# Patient Record
Sex: Female | Born: 1945 | Race: White | Hispanic: No | Marital: Married | Smoking: Former smoker
Health system: Southern US, Community
[De-identification: ages and names within clinical notes are randomized; demographics above are authoritative.]

## PROBLEM LIST (undated history)

## (undated) DIAGNOSIS — M1990 Unspecified osteoarthritis, unspecified site: Secondary | ICD-10-CM

## (undated) DIAGNOSIS — M810 Age-related osteoporosis without current pathological fracture: Principal | ICD-10-CM

## (undated) DIAGNOSIS — H9319 Tinnitus, unspecified ear: Secondary | ICD-10-CM

## (undated) DIAGNOSIS — R5383 Other fatigue: Secondary | ICD-10-CM

## (undated) DIAGNOSIS — N3943 Post-void dribbling: Secondary | ICD-10-CM

## (undated) DIAGNOSIS — M549 Dorsalgia, unspecified: Secondary | ICD-10-CM

## (undated) DIAGNOSIS — R51 Headache: Secondary | ICD-10-CM

## (undated) DIAGNOSIS — R0989 Other specified symptoms and signs involving the circulatory and respiratory systems: Secondary | ICD-10-CM

## (undated) DIAGNOSIS — C4490 Unspecified malignant neoplasm of skin, unspecified: Secondary | ICD-10-CM

## (undated) DIAGNOSIS — C50919 Malignant neoplasm of unspecified site of unspecified female breast: Secondary | ICD-10-CM

## (undated) DIAGNOSIS — K219 Gastro-esophageal reflux disease without esophagitis: Secondary | ICD-10-CM

## (undated) HISTORY — DX: Other fatigue: R53.83

## (undated) HISTORY — PX: COLONOSCOPY: SHX174

## (undated) HISTORY — DX: Headache: R51

## (undated) HISTORY — DX: Dorsalgia, unspecified: M54.9

## (undated) HISTORY — DX: Post-void dribbling: N39.43

## (undated) HISTORY — DX: Age-related osteoporosis without current pathological fracture: M81.0

## (undated) HISTORY — DX: Other specified symptoms and signs involving the circulatory and respiratory systems: R09.89

## (undated) HISTORY — DX: Tinnitus, unspecified ear: H93.19

## (undated) HISTORY — DX: Unspecified malignant neoplasm of skin, unspecified: C44.90

## (undated) HISTORY — DX: Malignant neoplasm of unspecified site of unspecified female breast: C50.919

---

## 1980-12-06 HISTORY — PX: TUBAL LIGATION: SHX77

## 1984-12-06 HISTORY — PX: EYE SURGERY: SHX253

## 2002-10-22 ENCOUNTER — Other Ambulatory Visit: Admission: RE | Admit: 2002-10-22 | Discharge: 2002-10-22 | Payer: Self-pay | Admitting: *Deleted

## 2003-01-07 ENCOUNTER — Encounter: Admission: RE | Admit: 2003-01-07 | Discharge: 2003-01-07 | Payer: Self-pay | Admitting: *Deleted

## 2003-02-25 ENCOUNTER — Ambulatory Visit (HOSPITAL_COMMUNITY): Admission: RE | Admit: 2003-02-25 | Discharge: 2003-02-25 | Payer: Self-pay | Admitting: Gastroenterology

## 2003-12-23 ENCOUNTER — Other Ambulatory Visit: Admission: RE | Admit: 2003-12-23 | Discharge: 2003-12-23 | Payer: Self-pay | Admitting: *Deleted

## 2007-07-11 ENCOUNTER — Encounter: Admission: RE | Admit: 2007-07-11 | Discharge: 2007-07-11 | Payer: Self-pay | Admitting: Obstetrics and Gynecology

## 2008-01-31 ENCOUNTER — Encounter: Admission: RE | Admit: 2008-01-31 | Discharge: 2008-01-31 | Payer: Self-pay | Admitting: Surgery

## 2009-02-04 ENCOUNTER — Encounter: Admission: RE | Admit: 2009-02-04 | Discharge: 2009-02-04 | Payer: Self-pay | Admitting: Orthopedic Surgery

## 2011-04-23 NOTE — Op Note (Signed)
   NAMEWENDA, Kristina Curry                             ACCOUNT NO.:  [id number]   MEDICAL RECORD NO.:  [id number]                   PATIENT TYPE:  AMB   LOCATION:  ENDO                                 FACILITY:  [facility]   PHYSICIAN:  Anselmo Rod, M.D.               DATE OF BIRTH:  02-15-1946   DATE OF PROCEDURE:  02/25/2003  DATE OF DISCHARGE:                                 OPERATIVE REPORT   PROCEDURE:  Screening colonoscopy.   ENDOSCOPIST:  Anselmo Rod, M.D.   INSTRUMENT USED:  Olympus video colonoscope.   INDICATIONS FOR PROCEDURE:  A 65 year old white female underwent a screening  colonoscopy to rule out colonic polyps, masses, etc.   PRE-PROCEDURE PREPARATION:  An informed consent was procured from the  patient and the patient was fasted for eight hours prior to the procedure  and prepped with a bottle of __________ and MiraLax on the night prior to  the procedure.   PRE-PROCEDURE PHYSICAL EXAM:  VITAL SIGNS:  Stable.  NECK:  Supple.  CHEST:  Clear to auscultation.  HEART:  S1, S2 regular.  ABDOMEN:  Soft with normal bowel sounds.   DESCRIPTION OF PROCEDURE:  The patient was placed in the left lateral  decubitus position and sedated with 50 mg of Demerol and 5 mg of Versed  intravenously.  Once the patient was adequately sedated and maintained on  low-flow oxygen and continuous cardiac monitoring, the Olympus video  colonoscope was advanced into the rectum to the cecum without difficulty.  There were a few scattered diverticula seen throughout the colon.  The  appendicular orifice and the ileocecal valve were clearly visualized and  photographed.  No masses, polyps, erosions, or ulcerations were present.  On  retroflexion in the rectum, revealed no acute abnormalities.  The patient tolerated the procedure well without complications.   IMPRESSION:  1. A few scattered diverticula.  2. No masses or polyps seen.   RECOMMENDATIONS:  1. Repeat colorectal cancer  screening is recommended in the next five years,     unless the patient develops any abnormal symptoms.  2.     Outpatient followup on a p.r.n. basis.  3. A high fiber diet with liberal fluid intake, 20-25 g of fiber recommended     in the diet, along with liberal fluid intake.                                               Anselmo Rod, M.D.    JNM/MEDQ  D:  02/25/2003  T:  02/25/2003  Job:  [id number]   cc:   Pershing Cox, M.D.  [address]  [address]  [place]  [place] [address]  Fax: [phone]

## 2012-07-20 ENCOUNTER — Other Ambulatory Visit: Payer: Self-pay

## 2012-07-21 ENCOUNTER — Other Ambulatory Visit: Payer: Self-pay | Admitting: *Deleted

## 2012-07-21 ENCOUNTER — Other Ambulatory Visit: Payer: Self-pay | Admitting: Radiology

## 2012-07-21 DIAGNOSIS — C50412 Malignant neoplasm of upper-outer quadrant of left female breast: Secondary | ICD-10-CM | POA: Insufficient documentation

## 2012-07-21 DIAGNOSIS — Z170 Estrogen receptor positive status [ER+]: Secondary | ICD-10-CM | POA: Insufficient documentation

## 2012-07-21 DIAGNOSIS — C50912 Malignant neoplasm of unspecified site of left female breast: Secondary | ICD-10-CM

## 2012-07-21 DIAGNOSIS — C50419 Malignant neoplasm of upper-outer quadrant of unspecified female breast: Secondary | ICD-10-CM

## 2012-07-24 ENCOUNTER — Telehealth: Payer: Self-pay | Admitting: *Deleted

## 2012-07-24 ENCOUNTER — Ambulatory Visit
Admission: RE | Admit: 2012-07-24 | Discharge: 2012-07-24 | Disposition: A | Discharge status: Home / Self Care | Payer: Medicare Other | Source: Ambulatory Visit | Attending: Radiology | Admitting: Radiology

## 2012-07-24 DIAGNOSIS — C50912 Malignant neoplasm of unspecified site of left female breast: Secondary | ICD-10-CM

## 2012-07-24 MED ORDER — GADOBENATE DIMEGLUMINE 529 MG/ML IV SOLN
15.0000 mL | Freq: Once | INTRAVENOUS | Status: AC | PRN
  Administered 2012-07-24: 15 mL via INTRAVENOUS

## 2012-07-24 NOTE — Telephone Encounter (Signed)
Confirmed [facility] for 07/26/12 at 0800 .  Instructions and contact information given.  

## 2012-07-26 ENCOUNTER — Encounter: Payer: Self-pay | Admitting: *Deleted

## 2012-07-26 ENCOUNTER — Ambulatory Visit: Payer: Medicare Other | Admitting: Genetic Counselor

## 2012-07-26 ENCOUNTER — Ambulatory Visit
Admission: RE | Admit: 2012-07-26 | Discharge: 2012-07-26 | Disposition: A | Discharge status: Home / Self Care | Payer: Medicare Other | Source: Ambulatory Visit | Attending: Radiation Oncology | Admitting: Radiation Oncology

## 2012-07-26 ENCOUNTER — Ambulatory Visit: Payer: Medicare Other | Attending: Surgery | Admitting: Physical Therapy

## 2012-07-26 ENCOUNTER — Ambulatory Visit: Payer: Medicare Other

## 2012-07-26 ENCOUNTER — Ambulatory Visit (HOSPITAL_BASED_OUTPATIENT_CLINIC_OR_DEPARTMENT_OTHER): Payer: Medicare Other | Admitting: Surgery

## 2012-07-26 ENCOUNTER — Other Ambulatory Visit (HOSPITAL_BASED_OUTPATIENT_CLINIC_OR_DEPARTMENT_OTHER): Payer: Medicare Other | Admitting: Lab

## 2012-07-26 ENCOUNTER — Other Ambulatory Visit (INDEPENDENT_AMBULATORY_CARE_PROVIDER_SITE_OTHER): Payer: Self-pay | Admitting: Surgery

## 2012-07-26 ENCOUNTER — Encounter: Payer: Self-pay | Admitting: Genetic Counselor

## 2012-07-26 ENCOUNTER — Ambulatory Visit (HOSPITAL_BASED_OUTPATIENT_CLINIC_OR_DEPARTMENT_OTHER): Payer: Medicare Other | Admitting: Oncology

## 2012-07-26 VITALS — BP 130/90 | HR 59 | Temp 98.0°F | Resp 20 | Ht 62.0 in | Wt 160.6 lb

## 2012-07-26 DIAGNOSIS — C50919 Malignant neoplasm of unspecified site of unspecified female breast: Secondary | ICD-10-CM | POA: Insufficient documentation

## 2012-07-26 DIAGNOSIS — M25619 Stiffness of unspecified shoulder, not elsewhere classified: Secondary | ICD-10-CM | POA: Insufficient documentation

## 2012-07-26 DIAGNOSIS — C50419 Malignant neoplasm of upper-outer quadrant of unspecified female breast: Secondary | ICD-10-CM

## 2012-07-26 DIAGNOSIS — IMO0001 Reserved for inherently not codable concepts without codable children: Secondary | ICD-10-CM | POA: Insufficient documentation

## 2012-07-26 DIAGNOSIS — M40299 Other kyphosis, site unspecified: Secondary | ICD-10-CM | POA: Insufficient documentation

## 2012-07-26 DIAGNOSIS — Z170 Estrogen receptor positive status [ER+]: Secondary | ICD-10-CM

## 2012-07-26 LAB — COMPREHENSIVE METABOLIC PANEL
ALT: 11 U/L (ref 0–35)
AST: 13 U/L (ref 0–37)
Alkaline Phosphatase: 55 U/L (ref 39–117)
BUN: 23 mg/dL (ref 6–23)
Calcium: 9.6 mg/dL (ref 8.4–10.5)
Chloride: 104 mEq/L (ref 96–112)
Creatinine, Ser: 0.74 mg/dL (ref 0.50–1.10)

## 2012-07-26 LAB — CBC WITH DIFFERENTIAL/PLATELET
BASO%: 1 % (ref 0.0–2.0)
Basophils Absolute: 0.1 10*3/uL (ref 0.0–0.1)
EOS%: 3.1 % (ref 0.0–7.0)
HCT: 38.8 % (ref 34.8–46.6)
MCH: 28.6 pg (ref 25.1–34.0)
MCHC: 32.8 g/dL (ref 31.5–36.0)
MCV: 87.4 fL (ref 79.5–101.0)
MONO%: 12.3 % (ref 0.0–14.0)
NEUT%: 56.7 % (ref 38.4–76.8)
RDW: 14.4 % (ref 11.2–14.5)
lymph#: 1.5 10*3/uL (ref 0.9–3.3)

## 2012-07-26 LAB — CANCER ANTIGEN 27.29: CA 27.29: 19 U/mL (ref 0–39)

## 2012-07-26 NOTE — Progress Notes (Addendum)
 Re:   Kristina Curry DOB:   03/21/1946 MRN:   [id number]  [facility]  ASSESSMENT AND PLAN: 1.  Left breast cancer  1.6 cm on MR, 3 o'clock  IDC, PR - 90%, PR - 90%, Her2Neu - neg, Ki67 - 20%  I discussed the options for breast cancer treatment with the patient.  I discussed the idea of a multidisciplinary approach to the treatment of breast cancer, which often includes medical oncology and radiation oncology.  I discussed the surgical options of lumpectomy vs. mastectomy.  If mastectomy, there is the possibility of reconstruction.   I discussed the options of lymph node biopsy.  The treatment plan depends on the pathologic staging of the tumor and the patient's personal wishes.  The risks of surgery include, but are not limited to, bleeding, infection, the need for further surgery, and nerve injury.  The patient has been given literature on the treatment of breast cancer.  Dr. Rubin and Wentworth are treating oncologist.  Plan:  Left breast lumpectomy (needle loc) and left axillary SLNBx.  2.  Strong family history of breast cancer - to see genetic  [BRCA 1/2 negative.  DN 08/10/2012] 3.  Umbilical hernia. 4.  Takes nightly sleeping pill. 5.  Uses vaginal estrogen cream she is going to stop. 6.  I gave her a note for jury duty.  REFERRING PHYSICIAN:  Dr. M. Bertrand  HISTORY OF PRESENT ILLNESS: Kristina Curry is a 66 y.o. (DOB: 01/08/1946)  white female whose primary care physician is Mark Mahoney, MD ([place], [place]) and comes to multi-disciplinary breast cancer clinic.  Her last mammogram was over 16 -17 months earlier.  She went for her normal mammogram and an abnormality was found in the left breast at 3 o'clock.  She had a biopsy on 07/18/2012 that showed IDC at [facility].  She has no prior history of breast biopsy or problems.  She comes from [place].  She sees Beth Lane at [facility] in [place].  She has a strong fam hx of breast cancer.  Her mother got breast cancer at  58 yo (she died at 82 of metastatic disease).  Her father's sister had breast cancer (when she was older).  Her mother's sister had breast cancer (when she was older).  She has one sister who has no breast problems.   Past Medical History  Diagnosis Date  . Breast cancer   . Fatigue   . Ringing in ears   . Sinus complaint   . Dribbling of urine   . Skin cancer   . Back pain   . Headache      No past surgical history on file.    Current Outpatient Prescriptions  Medication Sig Dispense Refill  . acetaminophen (TYLENOL) 325 MG tablet Take 650 mg by mouth as needed.      . ALPRAZolam (XANAX) 0.5 MG tablet Take 0.5 mg by mouth at bedtime as needed.      . estradiol (VAGIFEM) 25 MCG vaginal tablet Place 10 mcg vaginally 2 (two) times a week.      . Famotidine (PEPCID PO) Take 20 mg by mouth as needed.      . zolpidem (AMBIEN) 10 MG tablet Take 10 mg by mouth at bedtime as needed.         Not on File  REVIEW OF SYSTEMS: Skin:  No history of rash.  No history of abnormal moles. Infection:  No history of hepatitis or HIV.  No history of MRSA. Neurologic:    No history of stroke.  No history of seizure.  No history of headaches. Cardiac:  No history of hypertension. No history of heart disease.  No history of prior cardiac catheterization. Pulmonary:  Smokes 1 to 2 cigarettes per day.  Endocrine:  No diabetes. No thyroid disease. Gastrointestinal:  GERD  No history of liver disease.  No history of gall bladder disease.  No history of pancreas disease.  No history of colon disease. Urologic:  No history of kidney stones.  No history of bladder infections. Musculoskeletal:  No history of joint or back disease. Hematologic:  No bleeding disorder.  No history of anemia.  Not anticoagulated. Psycho-social:  The patient is oriented.   The patient has no obvious psychologic or social impairment to understanding our conversation and plan.  SOCIAL and FAMILY HISTORY: Married.  Husband with  her. 2 sons - 31 and 34. 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  PHYSICAL EXAM: There were no vitals taken for this visit.  General: WN older WF who is alert and generally healthy appearing.  HEENT: Normal. Pupils equal. Neck: Supple. No mass.  No thyroid mass. Lymph Nodes:  No supraclavicular, cervical nodes, axillary nodes. Breast:  Left - bruise and 1 cm palpable nodule at 3 o'clock  Right - negative  Lungs: Clear to auscultation and symmetric breath sounds. Heart:  RRR. No murmur or rub. Abdomen: Soft. No mass. No tenderness.  Normal bowel sounds.  TL scar.  Umbilical hernia. Extremities:  Good strength and ROM  in upper and lower extremities. Neurologic:  Grossly intact to motor and sensory function. Psychiatric: Has normal mood and affect. Behavior is normal.   DATA REVIEWED: Radiology and path reports. I spent at least 45 minutes with the patient. At least 50% of that was face to face.  Lelon Ikard, MD,  FACS [facility] [address]  [address]   [place], [place]    [address] Phone:  [phone] FAX:  [phone]  

## 2012-07-26 NOTE — Progress Notes (Signed)
Kristina Curry [id number] Feb 28, 1946 66 y.o. 07/26/2012 10:45 PM  CC Dr. Lorelei Pont  No primary provider on file. No primary provider on file.  REASON FOR CONSULTATION:  Breast cancer Patient was seen in the Multidisciplinary Breast Clinic for discussion of her treatment options. She was seen by Dr. Drue Second, Radiation Onc[facility]  STAGE:   Cancer of upper-outer quadrant of female breast   Primary site: Breast (Left)   Staging method: AJCC 7th Edition   Clinical: Stage IA (T1c, N0, cM0)   Summary: Stage IA (T1c, N0, cM0)  REFERRING PHYSICIAN: Dr. Lorelei Pont  HISTORY OF PRESENT ILLNESS:  Kristina Curry is a 66 y.o. female.  From [facility] who presents with abnormal mammogram. She did not detect any abnormalities in her breasts. are soft. She had a screening mammogram on 07/10/2012. Area abnormalities detected in the upper outer left breast. Additional views were recommended. A 1.7 cm area of focal asymmetry with poorly defined borders was seen. A biopsy is recommended and took place on 07/20/2012 this showed invasive ductal cancer grade 2, ER positive percent PR positive under present, HER-2 was negative and Ki-67 16%.  Past Medical History: Past Medical History  Diagnosis Date  . Breast cancer   . Fatigue   . Ringing in ears   . Sinus complaint   . Dribbling of urine   . Skin cancer   . Back pain   . Headache     Past Surgical History: No past surgical history on file.  Family History: Family History  Problem Relation Age of Onset  . Breast cancer Mother 44  . Bone cancer Father 60  . Breast cancer Maternal Aunt 80  . Breast cancer Paternal Aunt 52    double mastectomy  . Breast cancer Paternal Aunt     diagnosed in her 54s  . Breast cancer Paternal Aunt     diagnosed in her 82s    Social History History  Substance Use Topics  . Smoking status: Current Everyday Smoker  . Smokeless tobacco: Not on  file  . Alcohol Use: 4.2 oz/week    7 Glasses of wine per week   she is married and 4 I am husband Nadine Counts and they have 2 children ages 42, 87 for both single. 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 Her husband is retired.  Allergies: Not on File  Current Medications: Current Outpatient Prescriptions  Medication Sig Dispense Refill  . acetaminophen (TYLENOL) 325 MG tablet Take 650 mg by mouth as needed.      . ALPRAZolam (XANAX) 0.5 MG tablet Take 0.5 mg by mouth at bedtime as needed.      Marland Kitchen estradiol (VAGIFEM) 25 MCG vaginal tablet Place 10 mcg vaginally 2 (two) times a week.      . Famotidine (PEPCID PO) Take 20 mg by mouth as needed.      . zolpidem (AMBIEN) 10 MG tablet Take 10 mg by mouth at bedtime as needed.        OB/GYN History: G2 P2, menarche 48. She took hormone replacement therapy after menopause starting at age 59 and start stop this and 2005.  Fertility Discussion: nmo Prior History of Cancer: no  Health Maintenance:  Colonoscopy yes Bone Density yes Last PAP smear yes 2012  ECOG PERFORMANCE STATUS: 0 - Asymptomatic  Genetic Counseling/testing: tba  REVIEW OF SYSTEMS:  A comprehensive review of systems was negative.  PHYSICAL EXAMINATION: Blood pressure 130/90, pulse 59,  temperature 98 F (36.7 C), resp. rate 20, height 5\' 2"  (1.575 m), weight 160 lb 9.6 oz (72.848 kg).  HEENT:  Sclerae anicteric, conjunctivae pink.  Oropharynx clear.  No mucositis or candidiasis.  Nodes:  No cervical, supraclavicular, or axillary lymphadenopathy palpated.  Breast Exam:  Right breast is benign.  No masses, discharge, skin change, or nipple inversion.  Left breast is benign, there is a biopsy site present. No masses, discharge, skin change, or nipple inversion..  Lungs:  Clear to auscultation bilaterally.  No crackles, rhonchi, or wheezes.  Heart:  Regular rate and rhythm.  Abdomen:  Soft, nontender.  Positive bowel sounds.  No organomegaly or masses  palpated.  Musculoskeletal:  No focal spinal tenderness to palpation.  Extremities:  Benign.  No peripheral edema or cyanosis.  Skin:  Benign.  Neuro:  Nonfocal.      STUDIES/RESULTS: Mr Breast Bilateral W Wo Contrast  07/25/2012  *RADIOLOGY REPORT*  Clinical Data: Recent diagnosis of grade II breast carcinoma following ultrasound-guided core biopsy of mass in the 2 o'clock location of the left breast. The patient's mother, maternal aunt, and three paternal aunts were diagnosed with breast cancer.  BILATERAL BREAST MRI WITH AND WITHOUT CONTRAST  Technique: Multiplanar, multisequence MR images of both breasts were obtained prior to and following the intravenous administration of 15ml of Multihance.  Three dimensional images were evaluated at the independent DynaCad workstation.  Comparison:  Mammogram and ultrasound from [facility] 07/20/2012 and earlier  Findings: Background parenchymal enhancement is moderate.  Within the lateral midportion of the left breast, there is an enhancing mass which shows persistent type kinetics.  This is associated with clip artifact following recent ultrasound guided core biopsy showing malignancy.  This mass measures 1.3 x 1.5 x 1.6 cm. Elsewhere within the left breast, no suspicious findings are identified.  Images of the right breast are unremarkable.  No suspicious internal mammary or axillary lymph nodes are identified.  Note is made of a hiatal hernia.  IMPRESSION:  Solitary 1.6 cm mass within the lateral midportion of the left breast, consistent with known malignancy.  RECOMMENDATION: Treatment plan  THREE-DIMENSIONAL MR IMAGE RENDERING ON INDEPENDENT WORKSTATION:  Three-dimensional MR images were rendered by post-processing of the original MR data on an independent workstation.  The three- dimensional MR images were interpreted, and findings were reported in the accompanying complete MRI report for this study.  BI-RADS CATEGORY 6:  Known biopsy-proven  malignancy - appropriate action should be taken.   Original Report Authenticated By: Patterson Hammersmith, M.D.      LABS:    Chemistry      Component Value Date/Time   NA 139 07/26/2012 0830   K 3.7 07/26/2012 0830   CL 104 07/26/2012 0830   CO2 29 07/26/2012 0830   BUN 23 07/26/2012 0830   CREATININE 0.74 07/26/2012 0830      Component Value Date/Time   CALCIUM 9.6 07/26/2012 0830   ALKPHOS 55 07/26/2012 0830   AST 13 07/26/2012 0830   ALT 11 07/26/2012 0830   BILITOT 0.3 07/26/2012 0830      Lab Results  Component Value Date   WBC 5.5 07/26/2012   HGB 12.7 07/26/2012   HCT 38.8 07/26/2012   MCV 87.4 07/26/2012   PLT 231 07/26/2012       PATHOLOGY ER/PR positive breast cancer HER-2 negative ASSESSMENT    Is a possible woman with ER/PR positive breast cancer. She is amenable to lumpectomy. We will discuss treatment options  after lumpectomy. no  Clinical Trial Eligibility: no Multidisciplinary conference discussion yes    PLAN:    This patient will undergo lumpectomy. She is ER/PR positive tumor. She is a good candidate for Oncotype test. Both of those results and make recommendations regarding adjuvant chemotherapy or not. As she is of a significant family history and so we will refer her for genetic testing as well. I will see her after that we obtain results from the Oncotype testing.       Discussion: Patient is being treated per NCCN breast cancer care guidelines appropriate for stage.1   Thank you so much for allowing me to participate in the care of Osmara Drummonds. I will continue to follow up the patient with you and assist in her care.  All questions were answered. The patient knows to call the clinic with any problems, questions or concerns. We can certainly see the patient much sooner if necessary.  I spent 20 minutes counseling the patient face to face. The total time spent in the appointment was 40 minutes.  Pierce Crane M.D. FRCP C. 07/26/2012, 10:45  PM

## 2012-07-26 NOTE — Progress Notes (Signed)
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 female who presented for a screening mammogram.  She was found to have a 1 cm left breast abnormality.  It measured 1.0 cm on ultrasound.  MRI confirmed a solitary finding of a 1.6 cm lesion in the left breast.  Right biopsy is negative. Receptors were positive but HER2 was negative.  She had no symptoms prior to biopsy.  She is from [place] and is accompanied by her husband today.   PREVIOUS RADIATION THERAPY: No  PAST MEDICAL HISTORY:  has a past medical history of Breast cancer; Fatigue; Ringing in ears; Sinus complaint; Dribbling of urine; Skin cancer; Back pain; and Headache.    PAST SURGICAL HISTORY:No past surgical history on file.  FAMILY HISTORY: family history includes Bone cancer in her father; Breast cancer in her maternal aunt, mother, and paternal aunt; and Colon cancer in her paternal aunt.  SOCIAL HISTORY:  reports that she has been smoking.  She does not have any smokeless tobacco history on file. She reports that she drinks about 4.2 ounces of alcohol per week. She reports that she does not use illicit drugs.  ALLERGIES: Review of patient's allergies indicates not on file.  MEDICATIONS:  Current Outpatient Prescriptions  Medication Sig Dispense Refill  . acetaminophen (TYLENOL) 325 MG tablet Take 650 mg by mouth as needed.      . ALPRAZolam (XANAX) 0.5 MG tablet Take 0.5 mg by mouth at bedtime as needed.      Marland Kitchen estradiol (VAGIFEM) 25 MCG vaginal tablet Place 10 mcg vaginally 2 (two) times a week.      . Famotidine (PEPCID PO) Take 20 mg by mouth as needed.      . zolpidem (AMBIEN) 10 MG tablet Take 10 mg  by mouth at bedtime as needed.        REVIEW OF SYSTEMS:  A 15 point review of systems is documented in the electronic medical record. This was obtained by the nursing staff. However, I reviewed this with the patient to discuss relevant findings and make appropriate changes.  Pertinent items are noted in HPI.   PHYSICAL EXAM: Pleasant female in no distress, sitting in a chair. No palpable axillary, supraclavicular, or cervical adenopathy. No palpable abnormalities of the right breast. Biopsy changes in the upper outer quadrant of the left breast. Small hematoma under the scar. Alert and oriented x 3. CN II- XII intact.   LABORATORY DATA:  Lab Results  Component Value Date   WBC 5.5 07/26/2012   HGB 12.7 07/26/2012   HCT 38.8 07/26/2012   MCV 87.4 07/26/2012   PLT 231 07/26/2012   Lab Results  Component Value Date   NA 139 07/26/2012   K 3.7 07/26/2012   CL 104 07/26/2012   CO2 29 07/26/2012   Lab Results  Component Value Date   ALT 11 07/26/2012   AST 13 07/26/2012   ALKPHOS 55 07/26/2012   BILITOT 0.3 07/26/2012     RADIOGRAPHY: Mr Breast Bilateral W Wo Contrast  07/25/2012  *RADIOLOGY REPORT*  Clinical Data: Recent diagnosis of grade II breast carcinoma following ultrasound-guided core biopsy of mass in the 2 o'clock location of the left breast. The  patient's mother, maternal aunt, and three paternal aunts were diagnosed with breast cancer.  BILATERAL BREAST MRI WITH AND WITHOUT CONTRAST  Technique: Multiplanar, multisequence MR images of both breasts were obtained prior to and following the intravenous administration of 15ml of Multihance.  Three dimensional images were evaluated at the independent DynaCad workstation.  Comparison:  Mammogram and ultrasound from [facility] 07/20/2012 and earlier  Findings: Background parenchymal enhancement is moderate.  Within the lateral midportion of the left breast, there is an enhancing mass which shows persistent type kinetics.  This is  associated with clip artifact following recent ultrasound guided core biopsy showing malignancy.  This mass measures 1.3 x 1.5 x 1.6 cm. Elsewhere within the left breast, no suspicious findings are identified.  Images of the right breast are unremarkable.  No suspicious internal mammary or axillary lymph nodes are identified.  Note is made of a hiatal hernia.  IMPRESSION:  Solitary 1.6 cm mass within the lateral midportion of the left breast, consistent with known malignancy.  RECOMMENDATION: Treatment plan  THREE-DIMENSIONAL MR IMAGE RENDERING ON INDEPENDENT WORKSTATION:  Three-dimensional MR images were rendered by post-processing of the original MR data on an independent workstation.  The three- dimensional MR images were interpreted, and findings were reported in the accompanying complete MRI report for this study.  BI-RADS CATEGORY 6:  Known biopsy-proven malignancy - appropriate action should be taken.   Original Report Authenticated By: Patterson Hammersmith, M.D.       IMPRESSION: T1cN0 Invasive Ductal Carcinoma   PLAN: I talked to Kristina Curry and her husband today regarding her diagnosis and options for treatment. We discussed the results of randomized trials of breast conservation vs. Mastectomy and the equivalency in terms of survival. We discussed the role of radiation in decreasing local failure in patients who have had a lumpectomy.  We discussed the possibility of skin irritation, heart and lung damage.  We discussed 3-6 weeks as an outpatient.  We discussed the use of breath hold here in [place].  We discussed that given her smoking and age, she likely had enough lung damage to pull her heart out of the field.  We discussed that she would see me in follow up in [place] with sim to follow.  If her heart is significantly in the field, we could switch her to being treated down in [place] with breath hold. I told her Dr. Thersa Salt would likely be taking over full time care of our [place] patients in  October.  I spent 60 minutes  face to face with the patient and more than 50% of that time was spent in counseling and/or coordination of care.   ------------------------------------------------  Lurline Hare, MD

## 2012-07-26 NOTE — Progress Notes (Signed)
Dr. Pierce Crane requested a consultation for genetic counseling and risk assessment for Kristina Curry, a 66 y.o. female, for discussion of her personal history of breast cancer and family history of breast cancer. She presents to clinic today to discuss the possibility of a genetic predisposition to cancer, and to further clarify her risks, as well as her family members' risks for cancer.   HISTORY OF PRESENT ILLNESS: In August 2013, at the age of 39, Kristina Curry was diagnosed with invasive ductal carcinoma of the left breast.    Past Medical History  Diagnosis Date  . Breast cancer   . Fatigue   . Ringing in ears   . Sinus complaint   . Dribbling of urine   . Skin cancer   . Back pain   . Headache     No past surgical history on file.  History  Substance Use Topics  . Smoking status: Current Everyday Smoker  . Smokeless tobacco: Not on file  . Alcohol Use: 4.2 oz/week    7 Glasses of wine per week    REPRODUCTIVE HISTORY AND PERSONAL RISK ASSESSMENT FACTORS: Menarche was at age 8.   Menopause at 52 Uterus Intact: Yes Ovaries Intact: Yes G2P2A0 , first live birth at age 43  She has not previously undergone treatment for infertility.   OCP use for 8-9 years   She has not used HRT in the past.    FAMILY HISTORY:  We obtained a detailed, 4-generation family history.  Significant diagnoses are listed below: Family History  Problem Relation Age of Onset  . Breast cancer Mother 36  . Bone cancer Father 39  . Breast cancer Maternal Aunt 80  . Breast cancer Paternal Aunt 47    double mastectomy  . Breast cancer Paternal Aunt     diagnosed in her 29s  . Breast cancer Paternal Aunt     diagnosed in her 76s  There is no other reported history of cancer other than mentioned above.  Patient's maternal ancestors are of [place], [place] and [place] descent, and paternal ancestors are of [place] descent. There is no reported Ashkenazi Jewish ancestry. There is no  known  consanguinity.  GENETIC COUNSELING RISK ASSESSMENT, DISCUSSION, AND SUGGESTED FOLLOW UP: We reviewed the natural history and genetic etiology of sporadic, familial and hereditary cancer syndromes.  About 5-10% of breast cancer is hereditary.  Of this, about 85% is the result of a BRCA1 or BRCA2 mutation.  We reviewed the red flags of hereditary cancer syndromes and the dominant inheritance patterns.   The patient's personal and family history of breast cacner is suggestive of the following possible diagnosis: hereditary breast cancer  We discussed that identification of a hereditary cancer syndrome may help her care providers tailor the patients medical management. If a mutation indicating a hereditary cancer syndrome is detected in this case, the [employer] recommendations would include increased cancer surveillance and possible prophylactic surgery. If a mutation is detected, the patient will be referred back to the referring provider and to any additional appropriate care providers to discuss the relevant options.   If a mutation is not found in the patient, this will decrease the likelihood of a hereditary cancer syndrome as the explanation for her breast cancer. Cancer surveillance options would be discussed for the patient according to the appropriate standard National Comprehensive Cancer Network and American Cancer Society guidelines, with consideration of their personal and family history risk factors. In this case, the patient will  be referred back to their care providers for discussions of management.   In order to estimate hedr chance of having a BRCA1 or BRCA2 mutation, we used statistical models (Penn II) and laboratory data that take into account her personal medical history, family history and ancestry.  Because each model is different, there can be a lot of variability in the risks they give.  Therefore, these numbers must be considered a rough range and not a  precise risk of having a BRCA1 or BRCA2 mutation.  These models estimate that she has approximately a 6-8% chance of having a mutation. Based on this assessment of her family and personal history, genetic testing is recommended.  After considering the risks, benefits, and limitations, the patient provided informed consent for  the following  testing: [facility] through [employer].   Per the patient's request, we will contact her by telephone to discuss these results. A follow up genetic counseling visit will be scheduled if indicated.  The patient was seen for a total of 45 minutes, greater than 50% of which was spent face-to-face counseling.  This plan is being carried out per Dr. Renelda Loma recommendations.  This note will also be sent to the referring provider via the electronic medical record. The patient will be supplied with a summary of this genetic counseling discussion as well as educational information on the discussed hereditary cancer syndromes following the conclusion of their visit.   Patient was discussed with Dr. Drue Second.  _______________________________________________________________________ For Office Staff:  Number of people involved in session: 3 Was an Intern/ student involved with case: not applicable

## 2012-07-26 NOTE — Progress Notes (Signed)
[facility] Psychosocial Distress Screening Clinical Social Work  Patient completed the distress screening protocol, and scored a 6 on the Psychosocial Distress Thermometer which indicates moderate distress. Clinical Social Worker met with patient and patient's husband in [facility] to assess for distress and other psychosocial needs.  Pt stated she less anxious after meeting with the physicians and getting information on her treatment plan.  CSW informed pt of the support team and support services at [facility].  CSW provided pt with a patient and family support calendar, and other support resources.  Pt stated she was not interested in a reach to recovery referral at this time.  CSW encouraged pt to call with any additional questions or concerns.        Clinical Social Worker follow up needed:not at this time   Tamala Julian, MSW, LCSW Clinical Social Worker [facility] [phone]

## 2012-08-01 ENCOUNTER — Telehealth: Payer: Self-pay | Admitting: *Deleted

## 2012-08-01 NOTE — Telephone Encounter (Signed)
Spoke to pt concerning BMDC from 07/26/12.  Pt denies questions or concerns regarding dx or treatment care plan.  Confirmed appt with Dr. Donnie Coffin.  Gave date and time.  Encourage pt to call with further needs.  Received verbal understanding.  Contact information given.

## 2012-08-09 ENCOUNTER — Telehealth: Payer: Self-pay | Admitting: Genetic Counselor

## 2012-08-09 ENCOUNTER — Encounter (HOSPITAL_BASED_OUTPATIENT_CLINIC_OR_DEPARTMENT_OTHER): Payer: Self-pay | Admitting: *Deleted

## 2012-08-09 ENCOUNTER — Encounter: Payer: Self-pay | Admitting: Genetic Counselor

## 2012-08-09 NOTE — Progress Notes (Signed)
No labs needed

## 2012-08-09 NOTE — Telephone Encounter (Signed)
Revealed negative BRCA test results 

## 2012-08-09 NOTE — Telephone Encounter (Signed)
Left good news message on both cell and home phone.

## 2012-08-11 ENCOUNTER — Encounter (HOSPITAL_BASED_OUTPATIENT_CLINIC_OR_DEPARTMENT_OTHER)
Admission: RE | Disposition: A | Discharge status: Home / Self Care | Payer: Self-pay | Source: Ambulatory Visit | Attending: Surgery

## 2012-08-11 ENCOUNTER — Encounter (HOSPITAL_BASED_OUTPATIENT_CLINIC_OR_DEPARTMENT_OTHER): Payer: Self-pay | Admitting: *Deleted

## 2012-08-11 ENCOUNTER — Encounter (HOSPITAL_BASED_OUTPATIENT_CLINIC_OR_DEPARTMENT_OTHER): Payer: Self-pay | Admitting: Anesthesiology

## 2012-08-11 ENCOUNTER — Ambulatory Visit (HOSPITAL_COMMUNITY)
Admission: RE | Admit: 2012-08-11 | Discharge: 2012-08-11 | Disposition: A | Discharge status: Home / Self Care | Payer: Medicare Other | Source: Ambulatory Visit | Attending: Surgery | Admitting: Surgery

## 2012-08-11 ENCOUNTER — Ambulatory Visit (HOSPITAL_BASED_OUTPATIENT_CLINIC_OR_DEPARTMENT_OTHER): Payer: Medicare Other | Admitting: Anesthesiology

## 2012-08-11 ENCOUNTER — Ambulatory Visit (HOSPITAL_BASED_OUTPATIENT_CLINIC_OR_DEPARTMENT_OTHER)
Admission: RE | Admit: 2012-08-11 | Discharge: 2012-08-11 | Disposition: A | Discharge status: Home / Self Care | Payer: Medicare Other | Source: Ambulatory Visit | Attending: Surgery | Admitting: Surgery

## 2012-08-11 DIAGNOSIS — D0590 Unspecified type of carcinoma in situ of unspecified breast: Secondary | ICD-10-CM

## 2012-08-11 DIAGNOSIS — Z803 Family history of malignant neoplasm of breast: Secondary | ICD-10-CM | POA: Insufficient documentation

## 2012-08-11 DIAGNOSIS — C50919 Malignant neoplasm of unspecified site of unspecified female breast: Secondary | ICD-10-CM

## 2012-08-11 DIAGNOSIS — C50419 Malignant neoplasm of upper-outer quadrant of unspecified female breast: Secondary | ICD-10-CM | POA: Insufficient documentation

## 2012-08-11 HISTORY — DX: Unspecified osteoarthritis, unspecified site: M19.90

## 2012-08-11 HISTORY — DX: Gastro-esophageal reflux disease without esophagitis: K21.9

## 2012-08-11 LAB — POCT HEMOGLOBIN-HEMACUE: Hemoglobin: 12.9 g/dL (ref 12.0–15.0)

## 2012-08-11 SURGERY — BREAST LUMPECTOMY WITH NEEDLE LOCALIZATION AND AXILLARY SENTINEL LYMPH NODE BX
Anesthesia: General | Site: Breast | Laterality: Left | Wound class: Clean

## 2012-08-11 MED ORDER — HYDROCODONE-ACETAMINOPHEN 5-325 MG PO TABS: 1.0000 | ORAL_TABLET | Freq: Four times a day (QID) | ORAL | Status: AC | PRN

## 2012-08-11 MED ORDER — BUPIVACAINE HCL (PF) 0.25 % IJ SOLN
INTRAMUSCULAR | Status: DC | PRN
  Administered 2012-08-11: 30 mL

## 2012-08-11 MED ORDER — TECHNETIUM TC 99M SULFUR COLLOID FILTERED
1.0000 | Freq: Once | INTRAVENOUS | Status: AC | PRN
  Administered 2012-08-11: 1 via INTRADERMAL

## 2012-08-11 MED ORDER — CEFAZOLIN SODIUM-DEXTROSE 2-3 GM-% IV SOLR
2.0000 g | INTRAVENOUS | Status: AC
  Administered 2012-08-11: 2 g via INTRAVENOUS

## 2012-08-11 MED ORDER — ACETAMINOPHEN 10 MG/ML IV SOLN
1000.0000 mg | Freq: Once | INTRAVENOUS | Status: AC
  Administered 2012-08-11: 1000 mg via INTRAVENOUS

## 2012-08-11 MED ORDER — FENTANYL CITRATE 0.05 MG/ML IJ SOLN
INTRAMUSCULAR | Status: DC | PRN
  Administered 2012-08-11: 50 ug via INTRAVENOUS

## 2012-08-11 MED ORDER — EPHEDRINE SULFATE 50 MG/ML IJ SOLN
INTRAMUSCULAR | Status: DC | PRN
  Administered 2012-08-11: 10 mg via INTRAVENOUS

## 2012-08-11 MED ORDER — FENTANYL CITRATE 0.05 MG/ML IJ SOLN: 50.0000 ug | INTRAMUSCULAR | Status: DC | PRN

## 2012-08-11 MED ORDER — LACTATED RINGERS IV SOLN
INTRAVENOUS | Status: DC
  Administered 2012-08-11 (×3): via INTRAVENOUS

## 2012-08-11 MED ORDER — PROMETHAZINE HCL 25 MG/ML IJ SOLN: 6.2500 mg | INTRAMUSCULAR | Status: DC | PRN

## 2012-08-11 MED ORDER — SODIUM CHLORIDE 0.9 % IJ SOLN
INTRAMUSCULAR | Status: DC | PRN
  Administered 2012-08-11: 13:00:00 via INTRAMUSCULAR

## 2012-08-11 MED ORDER — CHLORHEXIDINE GLUCONATE 4 % EX LIQD: 1.0000 "application " | Freq: Once | CUTANEOUS | Status: DC

## 2012-08-11 MED ORDER — MIDAZOLAM HCL 2 MG/2ML IJ SOLN: 1.0000 mg | INTRAMUSCULAR | Status: DC | PRN

## 2012-08-11 MED ORDER — PROPOFOL 10 MG/ML IV BOLUS
INTRAVENOUS | Status: DC | PRN
  Administered 2012-08-11: 260 mg via INTRAVENOUS

## 2012-08-11 MED ORDER — DEXAMETHASONE SODIUM PHOSPHATE 4 MG/ML IJ SOLN
INTRAMUSCULAR | Status: DC | PRN
  Administered 2012-08-11: 10 mg via INTRAVENOUS

## 2012-08-11 MED ORDER — SCOPOLAMINE 1 MG/3DAYS TD PT72
1.0000 | MEDICATED_PATCH | Freq: Once | TRANSDERMAL | Status: DC
  Administered 2012-08-11: 1.5 mg via TRANSDERMAL

## 2012-08-11 MED ORDER — MIDAZOLAM HCL 2 MG/2ML IJ SOLN
1.0000 mg | INTRAMUSCULAR | Status: DC | PRN
  Administered 2012-08-11: 2 mg via INTRAVENOUS

## 2012-08-11 MED ORDER — HYDROMORPHONE HCL PF 1 MG/ML IJ SOLN
0.2500 mg | INTRAMUSCULAR | Status: DC | PRN
  Administered 2012-08-11 (×2): 0.5 mg via INTRAVENOUS

## 2012-08-11 MED ORDER — FENTANYL CITRATE 0.05 MG/ML IJ SOLN
50.0000 ug | INTRAMUSCULAR | Status: DC | PRN
  Administered 2012-08-11: 100 ug via INTRAVENOUS

## 2012-08-11 MED ORDER — LIDOCAINE HCL (CARDIAC) 20 MG/ML IV SOLN
INTRAVENOUS | Status: DC | PRN
  Administered 2012-08-11: 60 mg via INTRAVENOUS

## 2012-08-11 SURGICAL SUPPLY — 59 items
APPLIER CLIP 11 MED OPEN (CLIP)
BANDAGE ELASTIC 6 VELCRO ST LF (GAUZE/BANDAGES/DRESSINGS) IMPLANT
BINDER BREAST LRG (GAUZE/BANDAGES/DRESSINGS) ×2 IMPLANT
BINDER BREAST MEDIUM (GAUZE/BANDAGES/DRESSINGS) IMPLANT
BINDER BREAST XLRG (GAUZE/BANDAGES/DRESSINGS) IMPLANT
BINDER BREAST XXLRG (GAUZE/BANDAGES/DRESSINGS) IMPLANT
BLADE SURG 10 STRL SS (BLADE) ×2 IMPLANT
BLADE SURG 15 STRL LF DISP TIS (BLADE) ×1 IMPLANT
BLADE SURG 15 STRL SS (BLADE) ×1
CANISTER SUCTION 1200CC (MISCELLANEOUS) ×2 IMPLANT
CHLORAPREP W/TINT 26ML (MISCELLANEOUS) ×2 IMPLANT
CLIP APPLIE 11 MED OPEN (CLIP) IMPLANT
CLIP TI WIDE RED SMALL 6 (CLIP) ×2 IMPLANT
CLOTH BEACON ORANGE TIMEOUT ST (SAFETY) ×2 IMPLANT
COVER MAYO STAND STRL (DRAPES) ×2 IMPLANT
COVER PROBE W GEL 5X96 (DRAPES) ×2 IMPLANT
COVER TABLE BACK 60X90 (DRAPES) ×2 IMPLANT
DECANTER SPIKE VIAL GLASS SM (MISCELLANEOUS) IMPLANT
DERMABOND ADVANCED (GAUZE/BANDAGES/DRESSINGS) ×1
DERMABOND ADVANCED .7 DNX12 (GAUZE/BANDAGES/DRESSINGS) ×1 IMPLANT
DEVICE DUBIN W/COMP PLATE 8390 (MISCELLANEOUS) ×2 IMPLANT
DRAIN CHANNEL 19F RND (DRAIN) IMPLANT
DRAIN HEMOVAC 1/8 X 5 (WOUND CARE) IMPLANT
DRAPE LAPAROSCOPIC ABDOMINAL (DRAPES) ×2 IMPLANT
DRAPE UTILITY XL STRL (DRAPES) ×2 IMPLANT
ELECT COATED BLADE 2.86 ST (ELECTRODE) ×2 IMPLANT
ELECT REM PT RETURN 9FT ADLT (ELECTROSURGICAL) ×2
ELECTRODE REM PT RTRN 9FT ADLT (ELECTROSURGICAL) ×1 IMPLANT
EVACUATOR SILICONE 100CC (DRAIN) IMPLANT
GAUZE SPONGE 4X4 12PLY STRL LF (GAUZE/BANDAGES/DRESSINGS) IMPLANT
GAUZE SPONGE 4X4 16PLY XRAY LF (GAUZE/BANDAGES/DRESSINGS) IMPLANT
GLOVE ECLIPSE 6.5 STRL STRAW (GLOVE) ×4 IMPLANT
GLOVE SURG SIGNA 7.5 PF LTX (GLOVE) ×4 IMPLANT
GOWN PREVENTION PLUS XLARGE (GOWN DISPOSABLE) ×2 IMPLANT
GOWN PREVENTION PLUS XXLARGE (GOWN DISPOSABLE) ×2 IMPLANT
KIT MARKER MARGIN INK (KITS) ×2 IMPLANT
NDL SAFETY ECLIPSE 18X1.5 (NEEDLE) ×1 IMPLANT
NEEDLE HYPO 18GX1.5 SHARP (NEEDLE) ×1
NEEDLE HYPO 25X1 1.5 SAFETY (NEEDLE) ×4 IMPLANT
NS IRRIG 1000ML POUR BTL (IV SOLUTION) ×2 IMPLANT
PACK BASIN DAY SURGERY FS (CUSTOM PROCEDURE TRAY) ×2 IMPLANT
PAD ALCOHOL SWAB (MISCELLANEOUS) ×2 IMPLANT
PENCIL BUTTON HOLSTER BLD 10FT (ELECTRODE) ×2 IMPLANT
PIN SAFETY STERILE (MISCELLANEOUS) IMPLANT
SHEET MEDIUM DRAPE 40X70 STRL (DRAPES) ×2 IMPLANT
SLEEVE SCD COMPRESS KNEE MED (MISCELLANEOUS) ×2 IMPLANT
SPONGE GAUZE 4X4 12PLY (GAUZE/BANDAGES/DRESSINGS) ×2 IMPLANT
SPONGE LAP 18X18 X RAY DECT (DISPOSABLE) ×2 IMPLANT
SUT ETHILON 2 0 FS 18 (SUTURE) IMPLANT
SUT MON AB 5-0 PS2 18 (SUTURE) ×2 IMPLANT
SUT SILK 3 0 TIES 17X18 (SUTURE)
SUT SILK 3-0 18XBRD TIE BLK (SUTURE) IMPLANT
SUT VICRYL 3-0 CR8 SH (SUTURE) ×2 IMPLANT
SYR CONTROL 10ML LL (SYRINGE) ×4 IMPLANT
TOWEL OR 17X24 6PK STRL BLUE (TOWEL DISPOSABLE) ×2 IMPLANT
TOWEL OR NON WOVEN STRL DISP B (DISPOSABLE) ×2 IMPLANT
TUBE CONNECTING 20X1/4 (TUBING) ×2 IMPLANT
WATER STERILE IRR 1000ML POUR (IV SOLUTION) IMPLANT
YANKAUER SUCT BULB TIP NO VENT (SUCTIONS) ×2 IMPLANT

## 2012-08-11 NOTE — Anesthesia Preprocedure Evaluation (Addendum)
Anesthesia Evaluation  Patient identified by MRN, date of birth, ID band Patient awake    Reviewed: Allergy & Precautions, H&P , NPO status , Patient's Chart, lab work & pertinent test results, reviewed documented beta blocker date and time   Airway Mallampati: II TM Distance: >3 FB Neck ROM: full    Dental   Pulmonary neg pulmonary ROS,  breath sounds clear to auscultation        Cardiovascular negative cardio ROS  Rhythm:regular     Neuro/Psych  Headaches, negative psych ROS   GI/Hepatic Neg liver ROS, GERD-  Medicated and Controlled,  Endo/Other  negative endocrine ROS  Renal/GU negative Renal ROS  negative genitourinary   Musculoskeletal   Abdominal   Peds  Hematology negative hematology ROS (+)   Anesthesia Other Findings See surgeon's H&P   Reproductive/Obstetrics negative OB ROS                           Anesthesia Physical Anesthesia Plan  ASA: II  Anesthesia Plan: General   Post-op Pain Management:    Induction: Intravenous  Airway Management Planned: LMA  Additional Equipment:   Intra-op Plan:   Post-operative Plan: Extubation in OR  Informed Consent: I have reviewed the patients History and Physical, chart, labs and discussed the procedure including the risks, benefits and alternatives for the proposed anesthesia with the patient or authorized representative who has indicated his/her understanding and acceptance.   Dental Advisory Given  Plan Discussed with: CRNA and Surgeon  Anesthesia Plan Comments:         Anesthesia Quick Evaluation

## 2012-08-11 NOTE — Interval H&P Note (Signed)
History and Physical Interval Note:  08/11/2012 12:38 PM  Kristina Curry  has presented today for surgery, with the diagnosis of left breast cancer  The various methods of treatment have been discussed with the patient and family.  Husband at the bedside.  After consideration of risks, benefits and other options for treatment, the patient has consented to  Procedure(s) (LRB) with comments:  Left  BREAST LUMPECTOMY WITH NEEDLE LOCALIZATION AND AXILLARY SENTINEL LYMPH NODE BX (Left) - left breast lumpectomy needle localization and left axillary sentinel lymph node biopsy as a surgical intervention .    The patient's history has been reviewed, patient examined, no change in status, stable for surgery.  I have reviewed the patient's chart and labs.  Questions were answered to the patient's satisfaction.     Linn Clavin H

## 2012-08-11 NOTE — Op Note (Addendum)
08/11/2012  2:36 PM  PATIENT:  Kristina Curry, 66 y.o., female, MRN: [id number]  PREOP DIAGNOSIS:  left breast cancer  POSTOP DIAGNOSIS:   Invasive ductal carcinoma, left breast  , 3 o'clock position (T1, N0).  "Warm" area using Neoprobe about 3 cm below the lateral 1/3 of the left clavicle.  PROCEDURE:   Procedure(s): BREAST LUMPECTOMY WITH NEEDLE LOCALIZATION AND AXILLARY SENTINEL LYMPH NODE BX, Injection of 0.5 cc of 40% methylene blue in the subareolar space.  SURGEON:   Ovidio Kin, M.D.  ANESTHESIA:   general  Bedelia Person, MD - Anesthesiologist Curly Shores, CRNA - CRNA  General  EBL:  75  ml  DRAINS: none   LOCAL MEDICATIONS USED:   30 cc 1/4% marcaine  SPECIMEN:   Left breast lumpectomy and left axillary sentinel lymph node  COUNTS CORRECT:  YES  INDICATIONS FOR PROCEDURE:  Kristina Curry is a 66 y.o. (DOB: 1946/10/26) white female whose primary care physician is [facility], DO and comes for left breast lumpectomy and left axillary sentinel lymph node biopsy.   Options for breast cancer treatment were discussed with the patient. She elected to proceed  with lumpectomy and axillary sentinel lymph node.     The indications and potential complications of surgery were explained to the patient. Potential complications include, but are not limited to, bleeding, infection, the need for further surgery, and nerve injury.     The patient had a needle loc wire placed at the 3 o'clock position of the left breast at [facility].  In the holding area, her left areola was injected with 1 millicurie of Technitium Sulfur Colloid.   OPERATIVE NOTE:  The patient was taken to room # 8 at [facility] where she underwent a general anesthesia  supervised by Bedelia Person, MD - Anesthesiologist and Curly Shores, CRNA - CRNA. Her left breast and axilla were prepped with ChloraPrep and sterilely draped.   A time-out and the surgical check list was reviewed.   I injected  about 0.5 mL of 40% methylene blue around her 0.5 cc areola.  I started with the left axillary sentinel lymph node biopsy. I found a hot area at the junction of the breast and the pectoralis major muscle. I cut down and  identified a hot node that had counts of 1400 and the background has 20 counts. The lymph node was not blue. I checked her internal mammary nodes and supraclavicular nodes with the neoprobe and found no other hot area. The axillary node was then sent to pathology.   She did have a warm area (counts about 150) about 3 cm beneath the lateral 1/3 of her clavicle, towards the upper limits of her left breast.  I turned attention to the cancer which was about at the 3 o'clock position of the left breast. I cut down around the wire and tried to take an ellipse of breast tissue around the tumor by at least 1 cm.   I excised this block of breast tissue approximately 5 cm by 5 cm  in diameter. I did take the dissection down to the pectoralis major. I painted this specimen with the 6 color paint kit and did a specimen mammogram which confirmed the mass, clip and the wire were all in the right position.   I then irrigated the wound with saline. I infiltrated approximately 30 mL of 1% local between the 3 incisions. I placed 6 clips in the cancer of biopsy cavity, at 12,  3, 6, and 9 o'clock. Two clips were placed on the pectoralis major.   I then closed all the wounds in layers using 3-0 Vicryl sutures for the deep layer. At the skin, I closed the incisions with a 5-0 Monocryl suture. The incisions were then painted with Dermabond.  She had gauze place over the wounds and placed in a breast binder.  The patient tolerated the procedure well, was transported to the recovery room in good condition. Sponge and needle count were correct at the end of the case.   Final pathology is pending.   Ovidio Kin, MD, [facility] Surgery Pager: [phone] Office phone:  [phone]

## 2012-08-11 NOTE — Transfer of Care (Signed)
Immediate Anesthesia Transfer of Care Note  Patient: Kristina Curry  Procedure(s) Performed: Procedure(s) (LRB) with comments: BREAST LUMPECTOMY WITH NEEDLE LOCALIZATION AND AXILLARY SENTINEL LYMPH NODE BX (Left) - left breast lumpectomy needle localization and left axillary sentinel lymph node biopsy  Patient Location: PACU  Anesthesia Type: General  Level of Consciousness: sedated  Airway & Oxygen Therapy: Patient Spontanous Breathing and Patient connected to face mask oxygen  Post-op Assessment: Report given to PACU RN and Post -op Vital signs reviewed and stable  Post vital signs: Reviewed and stable  Complications: No apparent anesthesia complications

## 2012-08-11 NOTE — H&P (View-Only) (Signed)
Re:   Kristina Curry DOB:   1946/05/23 MRN:   [id number]  BMDC  ASSESSMENT AND PLAN: 1.  Left breast cancer  1.6 cm on MR, 3 o'clock  IDC, PR - 90%, PR - 90%, Her2Neu - neg, Ki67 - 20%  I discussed the options for breast cancer treatment with the patient.  I discussed the idea of a multidisciplinary approach to the treatment of breast cancer, which often includes medical oncology and radiation oncology.  I discussed the surgical options of lumpectomy vs. mastectomy.  If mastectomy, there is the possibility of reconstruction.   I discussed the options of lymph node biopsy.  The treatment plan depends on the pathologic staging of the tumor and the patient's personal wishes.  The risks of surgery include, but are not limited to, bleeding, infection, the need for further surgery, and nerve injury.  The patient has been given literature on the treatment of breast cancer.  Dr. Donnie Coffin and Michell Heinrich are treating oncologist.  Plan:  Left breast lumpectomy (needle loc) and left axillary SLNBx.  2.  Strong family history of breast cancer - to see genetic  [BRCA 1/2 negative.  DN 08/10/2012] 3.  Umbilical hernia. 4.  Takes nightly sleeping pill. 5.  Uses vaginal estrogen cream she is going to stop. 6.  I gave her a note for jury duty.  REFERRING PHYSICIAN:  Dr. Huey Romans  HISTORY OF PRESENT ILLNESS: Kristina Curry is a 66 y.o. (DOB: 17-Dec-1945)  white female whose primary care physician is Lorelei Pont, MD [place], [place]) and comes to multi-disciplinary breast cancer clinic.  Her last mammogram was over 16 -17 months earlier.  She went for her normal mammogram and an abnormality was found in the left breast at 3 o'clock.  She had a biopsy on 07/18/2012 that showed IDC at [facility].  She has no prior history of breast biopsy or problems.  She comes from [place], [place].  She sees Medical laboratory scientific officer at [facility] in [place].  She has a strong fam hx of breast cancer.  Her mother got breast cancer at  87 yo (she died at 50 of metastatic disease).  Her father's sister had breast cancer (when she was older).  Her mother's sister had breast cancer (when she was older).  She has one sister who has no breast problems.   Past Medical History  Diagnosis Date  . Breast cancer   . Fatigue   . Ringing in ears   . Sinus complaint   . Dribbling of urine   . Skin cancer   . Back pain   . Headache      No past surgical history on file.    Current Outpatient Prescriptions  Medication Sig Dispense Refill  . acetaminophen (TYLENOL) 325 MG tablet Take 650 mg by mouth as needed.      . ALPRAZolam (XANAX) 0.5 MG tablet Take 0.5 mg by mouth at bedtime as needed.      Marland Kitchen estradiol (VAGIFEM) 25 MCG vaginal tablet Place 10 mcg vaginally 2 (two) times a week.      . Famotidine (PEPCID PO) Take 20 mg by mouth as needed.      . zolpidem (AMBIEN) 10 MG tablet Take 10 mg by mouth at bedtime as needed.         Not on File  REVIEW OF SYSTEMS: Skin:  No history of rash.  No history of abnormal moles. Infection:  No history of hepatitis or HIV.  No history of MRSA. Neurologic:  No history of stroke.  No history of seizure.  No history of headaches. Cardiac:  No history of hypertension. No history of heart disease.  No history of prior cardiac catheterization. Pulmonary:  Smokes 1 to 2 cigarettes per day.  Endocrine:  No diabetes. No thyroid disease. Gastrointestinal:  GERD  No history of liver disease.  No history of gall bladder disease.  No history of pancreas disease.  No history of colon disease. Urologic:  No history of kidney stones.  No history of bladder infections. Musculoskeletal:  No history of joint or back disease. Hematologic:  No bleeding disorder.  No history of anemia.  Not anticoagulated. Psycho-social:  The patient is oriented.   The patient has no obvious psychologic or social impairment to understanding our conversation and plan.  SOCIAL and FAMILY HISTORY: Married.  Husband with  her. 2 sons - 96 and 89. 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  PHYSICAL EXAM: There were no vitals taken for this visit.  General: WN older WF who is alert and generally healthy appearing.  HEENT: Normal. Pupils equal. Neck: Supple. No mass.  No thyroid mass. Lymph Nodes:  No supraclavicular, cervical nodes, axillary nodes. Breast:  Left - bruise and 1 cm palpable nodule at 3 o'clock  Right - negative  Lungs: Clear to auscultation and symmetric breath sounds. Heart:  RRR. No murmur or rub. Abdomen: Soft. No mass. No tenderness.  Normal bowel sounds.  TL scar.  Umbilical hernia. Extremities:  Good strength and ROM  in upper and lower extremities. Neurologic:  Grossly intact to motor and sensory function. Psychiatric: Has normal mood and affect. Behavior is normal.   DATA REVIEWED: Radiology and path reports. I spent at least 45 minutes with the patient. At least 50% of that was face to face.  Ovidio Kin, MD,  [facility] [address]  [address]   [place], [place]    [address] Phone:  [phone] FAX:  [phone]

## 2012-08-11 NOTE — Anesthesia Procedure Notes (Addendum)
Procedure Name: LMA Insertion Date/Time: 08/11/2012 1:04 PM Performed by: Meyer Russel Pre-anesthesia Checklist: Patient identified, Emergency Drugs available, Suction available and Patient being monitored Patient Re-evaluated:Patient Re-evaluated prior to inductionOxygen Delivery Method: Circle System Utilized Preoxygenation: Pre-oxygenation with 100% oxygen Intubation Type: IV induction Ventilation: Mask ventilation without difficulty LMA: LMA inserted LMA Size: 4.0 Number of attempts: 2 Airway Equipment and Method: bite block Placement Confirmation: positive ETCO2 and breath sounds checked- equal and bilateral Tube secured with: Tape Dental Injury: Teeth and Oropharynx as per pre-operative assessment

## 2012-08-11 NOTE — Anesthesia Postprocedure Evaluation (Signed)
  Anesthesia Post-op Note  Patient: Kristina Curry  Procedure(s) Performed: Procedure(s) (LRB) with comments: BREAST LUMPECTOMY WITH NEEDLE LOCALIZATION AND AXILLARY SENTINEL LYMPH NODE BX (Left) - left breast lumpectomy needle localization and left axillary sentinel lymph node biopsy  Patient Location: PACU  Anesthesia Type: General  Level of Consciousness: awake and alert   Airway and Oxygen Therapy: Patient Spontanous Breathing  Post-op Pain: mild  Post-op Assessment: Post-op Vital signs reviewed, Patient's Cardiovascular Status Stable, Respiratory Function Stable, Patent Airway, No signs of Nausea or vomiting and Pain level controlled  Post-op Vital Signs: stable  Complications: No apparent anesthesia complications

## 2012-08-21 HISTORY — PX: BREAST LUMPECTOMY: SHX2

## 2012-08-25 ENCOUNTER — Ambulatory Visit (INDEPENDENT_AMBULATORY_CARE_PROVIDER_SITE_OTHER): Payer: Medicare Other | Admitting: Surgery

## 2012-08-25 ENCOUNTER — Encounter (INDEPENDENT_AMBULATORY_CARE_PROVIDER_SITE_OTHER): Payer: Self-pay | Admitting: Surgery

## 2012-08-25 VITALS — BP 98/76 | HR 76 | Temp 97.2°F | Resp 16 | Ht 62.5 in | Wt 161.6 lb

## 2012-08-25 DIAGNOSIS — C50419 Malignant neoplasm of upper-outer quadrant of unspecified female breast: Secondary | ICD-10-CM

## 2012-08-25 NOTE — Progress Notes (Addendum)
Re:   Kristina Curry DOB:   12-Aug-1946 MRN:   [id number]  BMDC  ASSESSMENT AND PLAN: 1.  Left breast cancer, T1c, N0  Final path - 1.7 cm, 0/1 nodes.  Left breast lumpectomy 08/11/2012.  IDC, PR - 100%, PR - 100%, Her2Neu - neg, Ki67 - 17%  Dr. Donnie Coffin and Michell Heinrich are treating oncologist.  She sees Dr. Donnie Coffin on 09/08/2012.  Doing well.  I wrote her a note to be out of work 09/11/2012.  To see me back in 6 months.   [Oncotype DX - 18, Recurrence rate - 11%.  DN  09/05/2012]  2.  BRCA 1/2 negative.   3.  Umbilical hernia. 4.  Takes nightly sleeping pill. 5.  Uses vaginal estrogen cream she is going to stop. 6.  I gave her a note for jury duty.  REFERRING PHYSICIAN:  Dr. Huey Romans  HISTORY OF PRESENT ILLNESS: Kristina Curry is a 66 y.o. (DOB: 03-02-1946)  white female whose primary care physician is Lorelei Pont, MD [place], [place]) and comes for follow up of left breast lumpectomy.  Doing well with the incision.  She has an appt to see Dr. Donnie Coffin 09/08/2012.  He husband is with her.  Breast History: She had a left breast biopsy on 07/18/2012 that showed IDC at [facility].  She has no prior history of breast biopsy or problems.  She has a strong fam hx of breast cancer.  Her mother got breast cancer at 12 yo (she died at 22 of metastatic disease).  Her father's sister had breast cancer (when she was older).  Her mother's sister had breast cancer (when she was older).  She has one sister who has no breast problems.  She comes from [place], [place].  She sees Medical laboratory scientific officer at [facility] in [place].   Past Medical History  Diagnosis Date  . Breast cancer   . Fatigue   . Ringing in ears   . Sinus complaint   . Dribbling of urine   . Skin cancer   . Back pain   . Headache   . GERD (gastroesophageal reflux disease)   . Arthritis     Past Surgical History  Procedure Date  . Tubal ligation 1982  . Eye surgery 1986    lt cataract  . Colonoscopy       Current Outpatient  Prescriptions  Medication Sig Dispense Refill  . acetaminophen (TYLENOL) 325 MG tablet Take 650 mg by mouth as needed.      . ALPRAZolam (XANAX) 0.5 MG tablet Take 0.5 mg by mouth at bedtime as needed.      . famotidine (PEPCID) 20 MG tablet Take 20 mg by mouth 2 (two) times daily as needed.      . zolpidem (AMBIEN) 10 MG tablet Take 10 mg by mouth at bedtime as needed.         No Known Allergies  REVIEW OF SYSTEMS: Cardiac:  No history of hypertension. No history of heart disease.  No history of prior cardiac catheterization. Pulmonary:  Smokes 1 to 2 cigarettes per day.  Gastrointestinal:  GERD  No history of liver disease.  No history of gall bladder disease.  No history of pancreas disease.  No history of colon disease.  SOCIAL and FAMILY HISTORY: Married.  Husband with her. 2 sons - 1 and 55. 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  This is supposed to be a part time job.  PHYSICAL EXAM: BP 98/76  Pulse  76  Temp 97.2 F (36.2 C) (Temporal)  Resp 16  Ht 5' 2.5" (1.588 m)  Wt 161 lb 9.6 oz (73.301 kg)  BMI 29.09 kg/m2  General: WN older WF who is alert and generally healthy appearing.  Neck: Supple. No mass.  No thyroid mass. Lymph Nodes:  Left axillary incision looks good. Breast:  Left - Incision at 3 o'clock looks good.  Right - negative  DATA REVIEWED: Path reports.  Ovidio Kin, MD,  [facility] [address] [place]  [address]   [place], [place]    [address] Phone:  [phone] FAX:  [phone]

## 2012-08-29 ENCOUNTER — Telehealth: Payer: Self-pay | Admitting: *Deleted

## 2012-08-29 NOTE — Telephone Encounter (Signed)
per md reschedule patient for 09-12-2012 at 11:00am patient confirmed over the phone

## 2012-08-30 ENCOUNTER — Telehealth: Payer: Self-pay | Admitting: *Deleted

## 2012-08-30 NOTE — Telephone Encounter (Signed)
PER PT REQUEST, A LETTER HAS BEEN SENT TO PT HOME REQUESTING FROM HER EMPLOYER A 2 DAY EXTENSION OF ABSENCE

## 2012-09-05 ENCOUNTER — Encounter: Payer: Self-pay | Admitting: *Deleted

## 2012-09-05 NOTE — Progress Notes (Signed)
Received Oncotype Dx results of 18.  Gave copy to MD.  Took copy to Med Rec to scan. 

## 2012-09-07 ENCOUNTER — Ambulatory Visit: Payer: Medicare Other | Admitting: Oncology

## 2012-09-08 ENCOUNTER — Ambulatory Visit: Payer: Medicare Other | Admitting: Oncology

## 2012-09-08 ENCOUNTER — Telehealth: Payer: Self-pay | Admitting: Radiation Oncology

## 2012-09-08 NOTE — Telephone Encounter (Signed)
Returned patient's call. Patient called to inquire about when she will start radiation therapy. Routed in basket to Dr. Michell Heinrich. Informed patient this writer would contact her once radiation appointment was scheduled.

## 2012-09-12 ENCOUNTER — Ambulatory Visit (HOSPITAL_BASED_OUTPATIENT_CLINIC_OR_DEPARTMENT_OTHER): Payer: Medicare Other | Admitting: Oncology

## 2012-09-12 ENCOUNTER — Telehealth: Payer: Self-pay | Admitting: Oncology

## 2012-09-12 VITALS — BP 108/71 | HR 68 | Temp 97.9°F | Resp 20 | Ht 62.5 in | Wt 164.1 lb

## 2012-09-12 DIAGNOSIS — C50419 Malignant neoplasm of upper-outer quadrant of unspecified female breast: Secondary | ICD-10-CM

## 2012-09-12 DIAGNOSIS — Z170 Estrogen receptor positive status [ER+]: Secondary | ICD-10-CM

## 2012-09-12 NOTE — Telephone Encounter (Signed)
[facility] PT APPT SCHEDULE FOR December.

## 2012-09-12 NOTE — Progress Notes (Signed)
Hematology and Oncology Follow Up Visit  Kristina Curry [id number] [birth year] 66 y.o. 09/12/2012 10:10 PM   DIAGNOSIS:   ER/PR positive breast cancer status post lumpectomy and sentinel lymph node evaluation on 08/11/2012  PAST  Interim History:  The pathology showed a low-grade ER/PR positive HER-2 negative cancer measuring 1.6 cm. Final Oncotype score was 18 corresponding to risk of recurrence of 11%  Medications: I have reviewed the patient's current medications.  Allergies: No Known Allergies  Past Medical History, Surgical history, Social history, and Family History were reviewed and updated.  Review of Systems: Constitutional:  Negative for fever, chills, night sweats, anorexia, weight loss, pain. Cardiovascular: negative Respiratory: negative Neurological: negative Dermatological: negative ENT: negative Skin Gastrointestinal: negative Genito-Urinary: negative Hematological and Lymphatic: negative Breast: negative Musculoskeletal: negative Remaining ROS negative.  Physical Exam:  Blood pressure 108/71, pulse 68, temperature 97.9 F (36.6 C), resp. rate 20, height 5' 2.5" (1.588 m), weight 164 lb 1.6 oz (74.435 kg).  ECOG: 0   HEENT:  Sclerae anicteric, conjunctivae pink.  Oropharynx clear.  No mucositis or candidiasis.  Nodes:  No cervical, supraclavicular, or axillary lymphadenopathy palpated.  Breast Exam:  Right breast is benign.  No masses, discharge, skin change, or nipple inversion.  Left breast is status post lumpectomy .  Lungs:  Clear to auscultation bilaterally.  No crackles, rhonchi, or wheezes.  Heart:  Regular rate and rhythm.  Abdomen:  Soft, nontender.  Positive bowel sounds.  No organomegaly or masses palpated.  Musculoskeletal:  No focal spinal tenderness to palpation.  Extremities:  Benign.  No peripheral edema or cyanosis.  Skin:  Benign.  Neuro:  Nonfocal.    Lab Results: Lab Results  Component Value Date   WBC 5.5 07/26/2012   HGB 12.9  08/11/2012   HCT 38.8 07/26/2012   MCV 87.4 07/26/2012   PLT 231 07/26/2012     Chemistry      Component Value Date/Time   NA 139 07/26/2012 0830   K 3.7 07/26/2012 0830   CL 104 07/26/2012 0830   CO2 29 07/26/2012 0830   BUN 23 07/26/2012 0830   CREATININE 0.74 07/26/2012 0830      Component Value Date/Time   CALCIUM 9.6 07/26/2012 0830   ALKPHOS 55 07/26/2012 0830   AST 13 07/26/2012 0830   ALT 11 07/26/2012 0830   BILITOT 0.3 07/26/2012 0830       Radiological Studies:  No results found.   IMPRESSIONS AND PLAN: A 66 y.o. female with   Node-negative ER/PR positive breast cancer. She has a borderline low Oncotype score. I discussed this with her. Her risk of recurrence is fairly low. She is comfortable going ahead with radiation therapy followed by adjuvant hormonal therapy. I will plan to see her in followup after radiation I will let radiation oncologist noted that she is ready to start radiation.  Spent more than half the time coordinating care, as well as discussion of BMI and its implications.      Kristina Curry 10/8/201310:10 PM Cell [phone]

## 2012-09-13 ENCOUNTER — Telehealth: Payer: Self-pay | Admitting: *Deleted

## 2012-09-13 NOTE — Telephone Encounter (Signed)
Called Beth in [place] because information was faxed but scheduled w/ the Med Onc and it needed to be scheduled w/ the Rad Onc.  She cancelled the Med Onc appt and gave me a new appt for Rad Onc.

## 2012-09-13 NOTE — Telephone Encounter (Signed)
Called patient to explain schedule mistake and she understood and was fine.  Confirmed Rad Onc appt of 09/15/12 at 8:45 w/ Dr. Thersa Salt w/ pt.  Faxed Path report to [facility] in [place].

## 2012-09-14 ENCOUNTER — Encounter (INDEPENDENT_AMBULATORY_CARE_PROVIDER_SITE_OTHER): Payer: Self-pay

## 2012-11-07 ENCOUNTER — Telehealth: Payer: Self-pay | Admitting: Oncology

## 2012-11-07 ENCOUNTER — Ambulatory Visit (HOSPITAL_BASED_OUTPATIENT_CLINIC_OR_DEPARTMENT_OTHER): Payer: Medicare Other | Admitting: Oncology

## 2012-11-07 VITALS — BP 108/75 | HR 67 | Temp 98.2°F | Resp 20 | Ht 62.5 in | Wt 164.6 lb

## 2012-11-07 DIAGNOSIS — Z170 Estrogen receptor positive status [ER+]: Secondary | ICD-10-CM

## 2012-11-07 DIAGNOSIS — E559 Vitamin D deficiency, unspecified: Secondary | ICD-10-CM

## 2012-11-07 DIAGNOSIS — M8580 Other specified disorders of bone density and structure, unspecified site: Secondary | ICD-10-CM

## 2012-11-07 DIAGNOSIS — E550 Rickets, active: Secondary | ICD-10-CM

## 2012-11-07 DIAGNOSIS — C50419 Malignant neoplasm of upper-outer quadrant of unspecified female breast: Secondary | ICD-10-CM

## 2012-11-07 MED ORDER — ANASTROZOLE 1 MG PO TABS: 1.0000 mg | ORAL_TABLET | Freq: Every day | ORAL | Status: DC

## 2012-11-07 NOTE — Telephone Encounter (Signed)
gve the pt her feb 2014 appt calendar along with the bone density appt.

## 2012-11-07 NOTE — Progress Notes (Signed)
Hematology and Oncology Follow Up Visit  Kristina Curry [id number] Dec 16, 1945 66 y.o. 11/07/2012 2:26 PM   DIAGNOSIS:   ER/PR positive breast cancer status post lumpectomy and sentinel lymph node evaluation on 08/11/2012  PAST  Interim History:  The pathology showed a low-grade ER/PR positive HER-2 negative cancer measuring 1.6 cm. Final Oncotype score was 18 corresponding to risk of recurrence of 11% S/p xrt completed mid-november by Dr Thersa Salt in [place].   Medications: I have reviewed the patient's current medications.  Allergies: No Known Allergies  Past Medical History, Surgical history, Social history, and Family History were reviewed and updated.  Review of Systems: Constitutional:  Negative for fever, chills, night sweats, anorexia, weight loss, pain. Cardiovascular: negative Respiratory: negative Neurological: negative Dermatological: negative ENT: negative Skin Gastrointestinal: negative Genito-Urinary: negative Hematological and Lymphatic: negative Breast: negative Musculoskeletal: negative Remaining ROS negative.  Physical Exam:  Blood pressure 108/75, pulse 67, temperature 98.2 F (36.8 C), resp. rate 20, height 5' 2.5" (1.588 m), weight 164 lb 9.6 oz (74.662 kg).  ECOG: 0   HEENT:  Sclerae anicteric, conjunctivae pink.  Oropharynx clear.  No mucositis or candidiasis.  Nodes:  No cervical, supraclavicular, or axillary lymphadenopathy palpated.  Breast Exam:  Right breast is benign.  No masses, discharge, skin change, or nipple inversion.  Left breast is status post lumpectomy . There are some radiation related changes which are fairly mild. Lungs:  Clear to auscultation bilaterally.  No crackles, rhonchi, or wheezes.  Heart:  Regular rate and rhythm.  Abdomen:  Soft, nontender.  Positive bowel sounds.  No organomegaly or masses palpated.  Musculoskeletal:  No focal spinal tenderness to palpation.  Extremities:  Benign.  No peripheral edema or cyanosis.  Skin:   Benign.  Neuro:  Nonfocal.    Lab Results: Lab Results  Component Value Date   WBC 5.5 07/26/2012   HGB 12.9 08/11/2012   HCT 38.8 07/26/2012   MCV 87.4 07/26/2012   PLT 231 07/26/2012     Chemistry      Component Value Date/Time   NA 139 07/26/2012 0830   K 3.7 07/26/2012 0830   CL 104 07/26/2012 0830   CO2 29 07/26/2012 0830   BUN 23 07/26/2012 0830   CREATININE 0.74 07/26/2012 0830      Component Value Date/Time   CALCIUM 9.6 07/26/2012 0830   ALKPHOS 55 07/26/2012 0830   AST 13 07/26/2012 0830   ALT 11 07/26/2012 0830   BILITOT 0.3 07/26/2012 0830       Radiological Studies:  No results found.   IMPRESSIONS AND PLAN: A 66 y.o. female with   Node-negative ER/PR positive breast cancer. She has a borderline low Oncotype score. I discussed this with her. Her risk of recurrence is fairly low.  I spent 20 minutes discussing adjuvant hormonal therapy with her. Her risks from taking tamoxifen are low. I have sent the script to her pharmacy and will see her in 3 months.  Spent more than half the time coordinating care, as well as discussion of BMI and its implications.      Jadelynn Boylan 12/3/20132:26 PM Cell [phone]

## 2012-12-14 ENCOUNTER — Telehealth: Payer: Self-pay | Admitting: Oncology

## 2012-12-14 NOTE — Telephone Encounter (Signed)
Returned patient call regarding cancelling [name] appt.  She requested to be seen on 2/18 at 1:30 for lab.   I told her we would do our best

## 2013-01-02 ENCOUNTER — Encounter: Payer: Self-pay | Admitting: *Deleted

## 2013-01-02 NOTE — Progress Notes (Signed)
Called and spoke with patient about rescheduling her appt.  Confirmed appt. With Warner Mccreedy, NP on 01/23/13 at 1:15pm.  Patient requests Dr. Welton Flakes.

## 2013-01-03 ENCOUNTER — Encounter: Payer: Self-pay | Admitting: Oncology

## 2013-01-09 ENCOUNTER — Other Ambulatory Visit: Payer: Medicare Other | Admitting: Lab

## 2013-01-09 ENCOUNTER — Ambulatory Visit: Payer: Medicare Other | Admitting: Oncology

## 2013-01-23 ENCOUNTER — Telehealth: Payer: Self-pay | Admitting: Oncology

## 2013-01-23 ENCOUNTER — Encounter: Payer: Self-pay | Admitting: Gynecologic Oncology

## 2013-01-23 ENCOUNTER — Ambulatory Visit (HOSPITAL_BASED_OUTPATIENT_CLINIC_OR_DEPARTMENT_OTHER): Payer: Medicare Other | Admitting: Gynecologic Oncology

## 2013-01-23 VITALS — BP 108/71 | HR 73 | Temp 98.1°F | Resp 18 | Ht 62.5 in | Wt 170.3 lb

## 2013-01-23 DIAGNOSIS — M899 Disorder of bone, unspecified: Secondary | ICD-10-CM

## 2013-01-23 DIAGNOSIS — C50419 Malignant neoplasm of upper-outer quadrant of unspecified female breast: Secondary | ICD-10-CM

## 2013-01-23 DIAGNOSIS — C50919 Malignant neoplasm of unspecified site of unspecified female breast: Secondary | ICD-10-CM

## 2013-01-23 DIAGNOSIS — Z170 Estrogen receptor positive status [ER+]: Secondary | ICD-10-CM

## 2013-01-23 DIAGNOSIS — M8580 Other specified disorders of bone density and structure, unspecified site: Secondary | ICD-10-CM

## 2013-01-23 MED ORDER — ANASTROZOLE 1 MG PO TABS: 1.0000 mg | ORAL_TABLET | Freq: Every day | ORAL | Status: DC

## 2013-01-23 MED ORDER — DENOSUMAB 60 MG/ML ~~LOC~~ SOLN
60.0000 mg | Freq: Once | SUBCUTANEOUS | Status: DC
  Filled 2013-01-23: qty 1

## 2013-01-23 NOTE — Telephone Encounter (Signed)
gv pt appt for 07/16/13

## 2013-01-23 NOTE — Progress Notes (Signed)
OFFICE PROGRESS NOTE  CC  MAHONEY,MARK, DO No address on file  DIAGNOSIS:  Kristina Curry is a 67 year old [place] woman who had a screening mammogram on 07/10/2012 that resulted abnormalities in the upper outer left breast.  Additional views were recommended and a biopsy was performed on 07/20/12 showing an invasive ductal carcinoma, grade 2, with DCIS present, ER+ 100%, PR+ 100%, Ki67 17%, HER2- with ratio of 1.67.  MRI resulted a solitary 1.6 cm mass within the lateral midportion of the left breast, consistent with known malignancy.   PRIOR THERAPY:  On 08/11/12, she underwent a left breast lumpectomy with axillary sentinel lymph node biopsy by Dr. Ezzard Standing.  Final pathology resulted IDC, grade 1 of 3, spanning 1.7 cm with DCIS low to intermediate grade with surgical margins negative for carcinoma.  There was no evidence of carcinoma in 1 of 1 lymph node.  She completed radiation therapy in [place], [place] on 11/17/12.  She started Anastrozole 1 mg daily in 11/2012.   CURRENT THERAPY:  Anastrozole 1 mg daily since 11/2012.  INTERVAL HISTORY: Kristina Curry 67 y.o. female returns for continued follow up.  She has tolerated Anastrozole well with no hot flashes, mild joint pain that she relates to arthritis, and no vaginal dryness.  She reports intermittent right wrist pain that she thinks is carpal tunnel from overuse when working at [employer].  She reports taking Fosamax in the past, which caused severe acid reflux that she still has.  She states that she cannot tolerate calcium plus vitamin d supplementation due to moderate constipation.    MEDICAL HISTORY: Past Medical History  Diagnosis Date  . Breast cancer   . Fatigue   . Ringing in ears   . Sinus complaint   . Dribbling of urine   . Skin cancer   . Back pain   . Headache   . GERD (gastroesophageal reflux disease)   . Arthritis     ALLERGIES:  has No Known Allergies.  MEDICATIONS:  Current Outpatient Prescriptions  Medication Sig  Dispense Refill  . acetaminophen (TYLENOL) 325 MG tablet Take 650 mg by mouth as needed.      . ALPRAZolam (XANAX) 0.5 MG tablet Take 0.5 mg by mouth at bedtime as needed.      Marland Kitchen anastrozole (ARIMIDEX) 1 MG tablet Take 1 tablet (1 mg total) by mouth daily.  90 tablet  4  . famotidine (PEPCID) 20 MG tablet Take 20 mg by mouth 2 (two) times daily as needed.      . zolpidem (AMBIEN) 10 MG tablet Take 10 mg by mouth at bedtime as needed.       Current Facility-Administered Medications  Medication Dose Route Frequency Provider Last Rate Last Dose  . [START ON 07/10/2013] denosumab (PROLIA) injection 60 mg  60 mg Subcutaneous Once Doylene Bode, NP        SURGICAL HISTORY:  Past Surgical History  Procedure Laterality Date  . Tubal ligation  1982  . Eye surgery  1986    lt cataract  . Colonoscopy    . Breast lumpectomy  08/21/12    REVIEW OF SYSTEMS:  Constitutional: Feels well.  Cardiovascular: No chest pain, shortness of breath, or edema.  Pulmonary: No cough or wheeze.  Gastrointestinal: No nausea, vomiting, or diarrhea. No bright red blood per rectum or change in bowel movement.  Genitourinary: No frequency, urgency, or dysuria. No vaginal bleeding or discharge.  Musculoskeletal: No myalgia.  Intermittent right wrist pain. Neurologic: No weakness,  numbness, or change in gait.  Psychology: No depression, anxiety, or insomnia  HEALTH MAINTENANCE: Mammogram: 07/2012 Colonoscopy: with Dr. Loreta Ave, up to date Bone Density:  09/12/12 resulting osteopenia Pap Smear:  March 2013 with Debbora Dus, NP Eye Exam:  Yearly Vitamin D:  11/07/12 Lipid Panel:  Managed by Dr. Leary Roca  PHYSICAL EXAMINATION: Blood pressure 108/71, pulse 73, temperature 98.1 F (36.7 C), temperature source Oral, resp. rate 18, height 5' 2.5" (1.588 m), weight 170 lb 4.8 oz (77.248 kg). Body mass index is 30.63 kg/(m^2). ECOG PERFORMANCE STATUS: 1 - Symptomatic but completely ambulatory General: Well developed, well  nourished female in no acute distress. Alert and oriented x 3.  Head/ Neck: Oropharynx clear.  Sclerae anicteric.  Supple without any enlargements.  Lymph node survey: No cervical, supraclavicular, or axillary adenopathy  Cardiovascular: Regular rate and rhythm. S1 and S2 normal.  Lungs: Clear to auscultation bilaterally. No wheezes/crackles/rhonchi noted.  Skin: No rashes or lesions present. Back: No CVA tenderness  Abdomen: Abdomen soft, non-tender and obese. Active bowel sounds in all quadrants. No evidence of a fluid wave or abdominal masses.  Breasts:  Inspection negative with no nodularity, erythema, masses, or discharge noted bilaterally.  Left lumpectomy incision well healed. Extremities: No bilateral cyanosis, edema, or clubbing.   LABORATORY DATA: Lab Results  Component Value Date   WBC 5.5 07/26/2012   HGB 12.9 08/11/2012   HCT 38.8 07/26/2012   MCV 87.4 07/26/2012   PLT 231 07/26/2012      Chemistry      Component Value Date/Time   NA 139 07/26/2012 0830   K 3.7 07/26/2012 0830   CL 104 07/26/2012 0830   CO2 29 07/26/2012 0830   BUN 23 07/26/2012 0830   CREATININE 0.74 07/26/2012 0830      Component Value Date/Time   CALCIUM 9.6 07/26/2012 0830   ALKPHOS 55 07/26/2012 0830   AST 13 07/26/2012 0830   ALT 11 07/26/2012 0830   BILITOT 0.3 07/26/2012 0830       RADIOGRAPHIC STUDIES:  No results found.  ASSESSMENT:  67 year old [place] woman: #1  On 08/11/12, she underwent a left breast lumpectomy with axillary sentinel lymph node biopsy by Dr. 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    #2  She completed radiation therapy in [place], [place] on 11/17/12.    #3  She started Anastrozole 1 mg daily in 11/2012.   PLAN:  She is to continue taking Anastrozole as prescribed.  She is to follow up with Dr. Welton Flakes in August 2014 or sooner if problems arise.  She is advised to start taking Vitamin D3 2000 IU daily and continue weightbearing exercises.  A  Prolia injection was ordered today in order for prior authorization to be obtained prior to administration of Prolia at her next appointment with Dr. Welton Flakes in August 2014 for treatment of osteopenia.      All questions were answered. The patient knows to call the clinic with any problems, questions or concerns. We can certainly see the patient much sooner if necessary.  I spent 30 minutes counseling the patient face to face and Dr. Welton Flakes spent 10 minutes discussing future plans and recommendations with the patient. The total time spent in the appointment was 60 minutes.   Warner Mccreedy, NP Medical/Oncology [facility] [phone] (Office)  01/23/2013, 8:25 PM

## 2013-01-23 NOTE — Patient Instructions (Addendum)
Doing well.  Continue taking Anastrozole as prescribed.  Plan to follow up with Dr. Welton Flakes in August 2014 or sooner if problems arise.  Start taking Vitamin D3 2000 units daily and continue weightbearing exercises.  Please call for any questions or concerns.

## 2013-01-26 ENCOUNTER — Other Ambulatory Visit: Payer: Self-pay | Admitting: *Deleted

## 2013-03-28 ENCOUNTER — Ambulatory Visit (INDEPENDENT_AMBULATORY_CARE_PROVIDER_SITE_OTHER): Payer: Medicare Other | Admitting: Surgery

## 2013-03-28 ENCOUNTER — Encounter (INDEPENDENT_AMBULATORY_CARE_PROVIDER_SITE_OTHER): Payer: Self-pay | Admitting: Surgery

## 2013-03-28 VITALS — BP 120/62 | HR 67 | Temp 97.6°F | Resp 18 | Ht 62.5 in | Wt 163.0 lb

## 2013-03-28 DIAGNOSIS — C50412 Malignant neoplasm of upper-outer quadrant of left female breast: Secondary | ICD-10-CM

## 2013-03-28 DIAGNOSIS — C50419 Malignant neoplasm of upper-outer quadrant of unspecified female breast: Secondary | ICD-10-CM

## 2013-03-28 NOTE — Progress Notes (Signed)
Re:   Kristina Curry DOB:   05/19/1946 MRN:   [id number]  [facility]  ASSESSMENT AND PLAN: 1.  Left breast cancer, T1c, N0.  3 o'clock.  Final path - 1.7 cm, 0/1 nodes.  Left breast lumpectomy 08/11/2012.  IDC, PR - 100%, PR - 100%, Her2Neu - neg, Ki67 - 17%  Oncotype DX - 18, Recurrence rate - 11%.  Dr. Welton Flakes Donnie Coffin) and Michell Heinrich are treating oncologist.   She had her radiation therapy in [facility] by Dr. Karlton Lemon.  On anastrozole.  Doing well.    To see me back in 6 months.   2.  BRCA 1/2 negative.   3.  Umbilical hernia. 4.  Takes nightly sleeping pill. 5.  Uses vaginal estrogen cream she is going to stop. 6.  I gave her a note for jury duty.  REFERRING PHYSICIAN:  Dr. Huey Romans  HISTORY OF PRESENT ILLNESS: Kristina Curry is a 67 y.o. (DOB: [birth year])  white female whose primary care physician is Lorelei Pont, MD [place], [place]) and comes for follow up of left breast lumpectomy.  She is doing well.  She had her radiation therapy in [place] and did well with this.  She said that because of left breast rash - she had a lower dose with extra treatments.  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  I have no path report.  She is reading a mystery book - "critical mass"  Breast History: She had a left breast biopsy on 07/18/2012 that showed IDC at [facility].  She has no prior history of breast biopsy or problems.  She has a strong fam hx of breast cancer.  Her mother got breast cancer at 41 yo (she died at 38 of metastatic disease).  Her father's sister had breast cancer (when she was older).  Her mother's sister had breast cancer (when she was older).  She has one sister who has no breast problems.  She comes from [place], [place].  She sees Medical laboratory scientific officer at [facility] in [place].   Past Medical History  Diagnosis Date  . Breast cancer   . Fatigue   . Ringing in ears   . Sinus complaint   . Dribbling of urine   . Skin cancer   . Back  pain   . Headache   . GERD (gastroesophageal reflux disease)   . Arthritis     Past Surgical History  Procedure Laterality Date  . Tubal ligation  1982  . Eye surgery  1986    lt cataract  . Colonoscopy    . Breast lumpectomy  08/21/12      Current Outpatient Prescriptions  Medication Sig Dispense Refill  . acetaminophen (TYLENOL) 325 MG tablet Take 650 mg by mouth as needed.      . ALPRAZolam (XANAX) 0.5 MG tablet Take 0.5 mg by mouth at bedtime as needed.      Marland Kitchen anastrozole (ARIMIDEX) 1 MG tablet Take 1 tablet (1 mg total) by mouth daily.  90 tablet  4  . famotidine (PEPCID) 20 MG tablet Take 20 mg by mouth 2 (two) times daily as needed.      . zolpidem (AMBIEN) 10 MG tablet Take 10 mg by mouth at bedtime as needed.       No current facility-administered medications for this visit.     No Known Allergies  REVIEW OF SYSTEMS: Cardiac:  No history of hypertension. No history of heart disease.  No history of prior cardiac catheterization. Pulmonary:  Smokes 1 to 2 cigarettes per day.  Gastrointestinal:  GERD  No history of liver disease.  No history of gall bladder disease.  No history of pancreas disease.  No history of colon disease.  SOCIAL and FAMILY HISTORY: Married.  2 sons - 26 and 78. 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  This is supposed to be a part time job.  PHYSICAL EXAM: BP 120/62  Pulse 67  Temp(Src) 97.6 F (36.4 C) (Temporal)  Resp 18  Ht 5' 2.5" (1.588 m)  Wt 163 lb (73.936 kg)  BMI 29.32 kg/m2  General: WN older WF who is alert and generally healthy appearing.  Neck: Supple. No mass.  No thyroid mass. Lymph Nodes:  Left axillary incision looks good. Breast:  Left - Incision at 3 o'clock looks good.  She had a "skin cancer" from the upper inner aspect of her left breast - 11 o'clock scar.  Right - negative.  No mass.  DATA REVIEWED: None new.  Ovidio Kin, MD,  [facility] [address]   [address]   [place], [place]    [address] Phone:  [phone] FAX:  [phone]

## 2013-07-16 ENCOUNTER — Ambulatory Visit (HOSPITAL_BASED_OUTPATIENT_CLINIC_OR_DEPARTMENT_OTHER): Payer: Medicare Other

## 2013-07-16 ENCOUNTER — Ambulatory Visit (HOSPITAL_BASED_OUTPATIENT_CLINIC_OR_DEPARTMENT_OTHER): Payer: Medicare Other | Admitting: Oncology

## 2013-07-16 ENCOUNTER — Other Ambulatory Visit (HOSPITAL_BASED_OUTPATIENT_CLINIC_OR_DEPARTMENT_OTHER): Payer: Medicare Other | Admitting: Lab

## 2013-07-16 ENCOUNTER — Telehealth: Payer: Self-pay | Admitting: Oncology

## 2013-07-16 ENCOUNTER — Encounter: Payer: Self-pay | Admitting: Oncology

## 2013-07-16 VITALS — BP 96/64 | HR 68 | Temp 97.3°F | Resp 18 | Ht 62.0 in | Wt 157.6 lb

## 2013-07-16 DIAGNOSIS — C50412 Malignant neoplasm of upper-outer quadrant of left female breast: Secondary | ICD-10-CM

## 2013-07-16 DIAGNOSIS — C50419 Malignant neoplasm of upper-outer quadrant of unspecified female breast: Secondary | ICD-10-CM

## 2013-07-16 DIAGNOSIS — Z170 Estrogen receptor positive status [ER+]: Secondary | ICD-10-CM

## 2013-07-16 DIAGNOSIS — D0592 Unspecified type of carcinoma in situ of left breast: Secondary | ICD-10-CM

## 2013-07-16 DIAGNOSIS — M810 Age-related osteoporosis without current pathological fracture: Secondary | ICD-10-CM

## 2013-07-16 DIAGNOSIS — M899 Disorder of bone, unspecified: Secondary | ICD-10-CM

## 2013-07-16 DIAGNOSIS — C50919 Malignant neoplasm of unspecified site of unspecified female breast: Secondary | ICD-10-CM

## 2013-07-16 HISTORY — DX: Age-related osteoporosis without current pathological fracture: M81.0

## 2013-07-16 LAB — COMPREHENSIVE METABOLIC PANEL (CC13)
ALT: 21 U/L (ref 0–55)
Albumin: 3.6 g/dL (ref 3.5–5.0)
Alkaline Phosphatase: 61 U/L (ref 40–150)
CO2: 27 mEq/L (ref 22–29)
Potassium: 3.7 mEq/L (ref 3.5–5.1)
Sodium: 142 mEq/L (ref 136–145)
Total Bilirubin: <0.2 mg/dL (ref 0.20–1.20)
Total Protein: 7.5 g/dL (ref 6.4–8.3)

## 2013-07-16 LAB — CBC WITH DIFFERENTIAL/PLATELET
BASO%: 0.4 % (ref 0.0–2.0)
Eosinophils Absolute: 0.1 10*3/uL (ref 0.0–0.5)
LYMPH%: 21.2 % (ref 14.0–49.7)
MCHC: 32.8 g/dL (ref 31.5–36.0)
MONO#: 0.6 10*3/uL (ref 0.1–0.9)
NEUT#: 4.3 10*3/uL (ref 1.5–6.5)
Platelets: 248 10*3/uL (ref 145–400)
RBC: 4.58 10*6/uL (ref 3.70–5.45)
RDW: 14.3 % (ref 11.2–14.5)
WBC: 6.5 10*3/uL (ref 3.9–10.3)

## 2013-07-16 MED ORDER — DENOSUMAB 60 MG/ML ~~LOC~~ SOLN
60.0000 mg | Freq: Once | SUBCUTANEOUS | Status: AC
  Administered 2013-07-16: 60 mg via SUBCUTANEOUS
  Filled 2013-07-16: qty 1

## 2013-07-16 MED ORDER — SODIUM CHLORIDE 0.9 % IJ SOLN
10.0000 mL | INTRAMUSCULAR | Status: DC | PRN
  Filled 2013-07-16: qty 10

## 2013-07-16 MED ORDER — HEPARIN SOD (PORK) LOCK FLUSH 100 UNIT/ML IV SOLN
500.0000 [IU] | Freq: Once | INTRAVENOUS | Status: DC
  Filled 2013-07-16: qty 5

## 2013-07-16 NOTE — Patient Instructions (Addendum)
#1 overall you are doing well your tolerating the Arimidex very nicely. I suggest you continue this on a daily basis. Plan is to do a total of 5 years of therapy.  #2 we discussed bone density scan that was performed back in December 2013. You are osteopenic and you are at increased risk for developing osteoporosis secondary to the Arimidex. At your previous visit we discussed use of bisphosphonates to help strengthen bones. You are unable to take Actonel or any of the oral bisphosphonates therefore I have recommended you begin prolia injections every 6 months. We discussed the rationale as well as side effects of it. This would include but not limited to osteonecrosis of the jaw. Therefore it is my recommendation if you see a dentist to let them know that you are on probably. If you have any type of invasive dental work that needs to be done please make sure that you let them know about this drug. More information on prolia is as below.  #3 we'll see you back in 6 months time with another injection of prolia  Denosumab injection What is this medicine? DENOSUMAB slows bone breakdown. It is used to treat osteoporosis in women after menopause and in men. This medicine is also used to prevent bone fractures and other bone problems caused by cancer bone metastases. This medicine may be used for other purposes; ask your health care provider or pharmacist if you have questions. What should I tell my health care provider before I take this medicine? They need to know if you have any of these conditions: -dental disease -eczema -infection or history of infections -kidney disease or on dialysis -low blood calcium or vitamin D -malabsorption syndrome -scheduled to have surgery or tooth extraction -taking medicine that contains denosumab -thyroid or parathyroid disease -an unusual reaction to denosumab, other medicines, foods, dyes, or preservatives -pregnant or trying to get  pregnant -breast-feeding How should I use this medicine? This medicine is for injection under the skin. It is given by a health care professional in a hospital or clinic setting. If you are getting Prolia, a special MedGuide will be given to you by the pharmacist with each prescription and refill. Be sure to read this information carefully each time. Talk to your pediatrician regarding the use of this medicine in children. Special care may be needed. Overdosage: If you think you've taken too much of this medicine contact a poison control center or emergency room at once. Overdosage: If you think you have taken too much of this medicine contact a poison control center or emergency room at once. NOTE: This medicine is only for you. Do not share this medicine with others. What if I miss a dose? It is important not to miss your dose. Call your doctor or health care professional if you are unable to keep an appointment. What may interact with this medicine? Do not take this medicine with any of the following medications: -other medicines containing denosumab This medicine may also interact with the following medications: -medicines that suppress the immune system -medicines that treat cancer -steroid medicines like prednisone or cortisone This list may not describe all possible interactions. Give your health care provider a list of all the medicines, herbs, non-prescription drugs, or dietary supplements you use. Also tell them if you smoke, drink alcohol, or use illegal drugs. Some items may interact with your medicine. What should I watch for while using this medicine? Visit your doctor or health care professional for regular checks on  your progress. Your doctor or health care professional may order blood tests and other tests to see how you are doing. Call your doctor or health care professional if you get a cold or other infection while receiving this medicine. Do not treat yourself. This medicine  may decrease your body's ability to fight infection. You should make sure you get enough calcium and vitamin D while you are taking this medicine, unless your doctor tells you not to. Discuss the foods you eat and the vitamins you take with your health care professional. See your dentist regularly. Brush and floss your teeth as directed. Before you have any dental work done, tell your dentist you are receiving this medicine. What side effects may I notice from receiving this medicine? Side effects that you should report to your doctor or health care professional as soon as possible: -allergic reactions like skin rash, itching or hives, swelling of the face, lips, or tongue -breathing problems -chest pain -fast, irregular heartbeat -feeling faint or lightheaded, falls -fever, chills, or any other sign of infection -muscle spasms, tightening, or twitches -numbness or tingling -skin blisters or bumps, or is dry, peels, or red -slow healing or unexplained pain in the mouth or jaw -unusual bleeding or bruising Side effects that usually do not require medical attention (Report these to your doctor or health care professional if they continue or are bothersome.): -muscle pain -stomach upset, gas This list may not describe all possible side effects. Call your doctor for medical advice about side effects. You may report side effects to FDA at 1-800-FDA-1088. Where should I keep my medicine? This medicine is only given in a clinic, doctor's office, or other health care setting and will not be stored at home. NOTE: This sheet is a summary. It may not cover all possible information. If you have questions about this medicine, talk to your doctor, pharmacist, or health care provider.  2013, Elsevier/Gold Standard. (08/31/2011 3:40:41 PM)

## 2013-07-16 NOTE — Patient Instructions (Addendum)
Denosumab injection (Prolia) What is this medicine? DENOSUMAB slows bone breakdown. It is used to treat osteoporosis in women after menopause and in men. This medicine is also used to prevent bone fractures and other bone problems caused by cancer bone metastases. This medicine may be used for other purposes; ask your health care provider or pharmacist if you have questions. What should I tell my health care provider before I take this medicine? They need to know if you have any of these conditions: -dental disease -eczema -infection or history of infections -kidney disease or on dialysis -low blood calcium or vitamin D -malabsorption syndrome -scheduled to have surgery or tooth extraction -taking medicine that contains denosumab -thyroid or parathyroid disease -an unusual reaction to denosumab, other medicines, foods, dyes, or preservatives -pregnant or trying to get pregnant -breast-feeding How should I use this medicine? This medicine is for injection under the skin. It is given by a health care professional in a hospital or clinic setting. If you are getting Prolia, a special MedGuide will be given to you by the pharmacist with each prescription and refill. Be sure to read this information carefully each time. Talk to your pediatrician regarding the use of this medicine in children. Special care may be needed. Overdosage: If you think you've taken too much of this medicine contact a poison control center or emergency room at once. Overdosage: If you think you have taken too much of this medicine contact a poison control center or emergency room at once. NOTE: This medicine is only for you. Do not share this medicine with others. What if I miss a dose? It is important not to miss your dose. Call your doctor or health care professional if you are unable to keep an appointment. What may interact with this medicine? Do not take this medicine with any of the following medications: -other  medicines containing denosumab This medicine may also interact with the following medications: -medicines that suppress the immune system -medicines that treat cancer -steroid medicines like prednisone or cortisone This list may not describe all possible interactions. Give your health care provider a list of all the medicines, herbs, non-prescription drugs, or dietary supplements you use. Also tell them if you smoke, drink alcohol, or use illegal drugs. Some items may interact with your medicine. What should I watch for while using this medicine? Visit your doctor or health care professional for regular checks on your progress. Your doctor or health care professional may order blood tests and other tests to see how you are doing. Call your doctor or health care professional if you get a cold or other infection while receiving this medicine. Do not treat yourself. This medicine may decrease your body's ability to fight infection. You should make sure you get enough calcium and vitamin D while you are taking this medicine, unless your doctor tells you not to. Discuss the foods you eat and the vitamins you take with your health care professional. See your dentist regularly. Brush and floss your teeth as directed. Before you have any dental work done, tell your dentist you are receiving this medicine. What side effects may I notice from receiving this medicine? Side effects that you should report to your doctor or health care professional as soon as possible: -allergic reactions like skin rash, itching or hives, swelling of the face, lips, or tongue -breathing problems -chest pain -fast, irregular heartbeat -feeling faint or lightheaded, falls -fever, chills, or any other sign of infection -muscle spasms, tightening, or twitches -  numbness or tingling -skin blisters or bumps, or is dry, peels, or red -slow healing or unexplained pain in the mouth or jaw -unusual bleeding or bruising Side effects  that usually do not require medical attention (Report these to your doctor or health care professional if they continue or are bothersome.): -muscle pain -stomach upset, gas This list may not describe all possible side effects. Call your doctor for medical advice about side effects. You may report side effects to FDA at 1-800-FDA-1088. Where should I keep my medicine? This medicine is only given in a clinic, doctor's office, or other health care setting and will not be stored at home. NOTE: This sheet is a summary. It may not cover all possible information. If you have questions about this medicine, talk to your doctor, pharmacist, or health care provider.  2013, Elsevier/Gold Standard. (08/31/2011 3:40:41 PM)

## 2013-07-16 NOTE — Telephone Encounter (Signed)
, °

## 2013-07-16 NOTE — Progress Notes (Signed)
OFFICE PROGRESS NOTE  CC  MAHONEY,MARK, DO [address] [place] [address]  DIAGNOSIS:  Kristina Curry is a 67 year old [place] woman who had a screening mammogram on 07/10/2012 that resulted abnormalities in the upper outer left breast.  Additional views were recommended and a biopsy was performed on 07/20/12 showing an invasive ductal carcinoma, grade 2, with DCIS present, ER+ 100%, PR+ 100%, Ki67 17%, HER2- with ratio of 1.67.  MRI resulted a solitary 1.6 cm mass within the lateral midportion of the left breast, consistent with known malignancy.   PRIOR THERAPY:  On 08/11/12, she underwent a left breast lumpectomy with axillary sentinel lymph node biopsy by Dr. Ezzard Standing.  Final pathology resulted IDC, grade 1 of 3, spanning 1.7 cm with DCIS low to intermediate grade with surgical margins negative for carcinoma.  There was no evidence of carcinoma in 1 of 1 lymph node.  She completed radiation therapy in [place], [place] on 11/17/12.  She started Anastrozole 1 mg daily in 11/2012.   CURRENT THERAPY:  Anastrozole 1 mg daily since 11/2012.  INTERVAL HISTORY: Kristina Curry 67 y.o. female returns for continued follow up.  She has tolerated Anastrozole well with no hot flashes, mild joint pain that she relates to arthritis, and no vaginal dryness.  She reports intermittent right wrist pain that she thinks is carpal tunnel from overuse when working at [employer].  .  She states that she cannot tolerate calcium plus vitamin d supplementation due to moderate constipation.    MEDICAL HISTORY: Past Medical History  Diagnosis Date  . Breast cancer   . Fatigue   . Ringing in ears   . Sinus complaint   . Dribbling of urine   . Skin cancer   . Back pain   . Headache(784.0)   . GERD (gastroesophageal reflux disease)   . Arthritis     ALLERGIES:  has No Known Allergies.  MEDICATIONS:  Current Outpatient Prescriptions  Medication Sig Dispense Refill  . acetaminophen (TYLENOL) 325 MG  tablet Take 650 mg by mouth as needed.      . ALPRAZolam (XANAX) 0.5 MG tablet Take 0.5 mg by mouth at bedtime as needed.      Marland Kitchen anastrozole (ARIMIDEX) 1 MG tablet Take 1 tablet (1 mg total) by mouth daily.  90 tablet  4  . famotidine (PEPCID) 20 MG tablet Take 20 mg by mouth 2 (two) times daily as needed.      Marland Kitchen FLUoxetine (PROZAC) 10 MG tablet Take 10 mg by mouth daily.      Marland Kitchen HYDROcodone-acetaminophen (NORCO/VICODIN) 5-325 MG per tablet Take 1 tablet by mouth every 6 (six) hours as needed for pain.      Marland Kitchen zolpidem (AMBIEN) 10 MG tablet Take 10 mg by mouth at bedtime as needed.       No current facility-administered medications for this visit.    SURGICAL HISTORY:  Past Surgical History  Procedure Laterality Date  . Tubal ligation  1982  . Eye surgery  1986    lt cataract  . Colonoscopy    . Breast lumpectomy  08/21/12    REVIEW OF SYSTEMS:  Constitutional: Feels well.  Cardiovascular: No chest pain, shortness of breath, or edema.  Pulmonary: No cough or wheeze.  Gastrointestinal: No nausea, vomiting, or diarrhea. No bright red blood per rectum or change in bowel movement.  Genitourinary: No frequency, urgency, or dysuria. No vaginal bleeding or discharge.  Musculoskeletal: No myalgia.  Intermittent right wrist pain. Neurologic:  No weakness, numbness, or change in gait.  Psychology: No depression, anxiety, or insomnia  HEALTH MAINTENANCE: Mammogram: 07/2012 Colonoscopy: with Dr. Loreta Ave, up to date Bone Density:  09/12/12 resulting osteopenia Pap Smear:  March 2013 with Debbora Dus, NP Eye Exam:  Yearly Vitamin D:  11/07/12 Lipid Panel:  Managed by Dr. Leary Roca  PHYSICAL EXAMINATION: Blood pressure 96/64, pulse 68, temperature 97.3 F (36.3 C), temperature source Oral, resp. rate 18, height 5\' 2"  (1.575 m), weight 157 lb 9.6 oz (71.487 kg). Body mass index is 28.82 kg/(m^2). ECOG PERFORMANCE STATUS: 1 - Symptomatic but completely ambulatory General: Well developed, well  nourished female in no acute distress. Alert and oriented x 3.  Head/ Neck: Oropharynx clear.  Sclerae anicteric.  Supple without any enlargements.  Lymph node survey: No cervical, supraclavicular, or axillary adenopathy  Cardiovascular: Regular rate and rhythm. S1 and S2 normal.  Lungs: Clear to auscultation bilaterally. No wheezes/crackles/rhonchi noted.  Skin: No rashes or lesions present. Back: No CVA tenderness  Abdomen: Abdomen soft, non-tender and obese. Active bowel sounds in all quadrants. No evidence of a fluid wave or abdominal masses.  Breasts:  Inspection negative with no nodularity, erythema, masses, or discharge noted bilaterally.  Left lumpectomy incision well healed. Extremities: No bilateral cyanosis, edema, or clubbing.   LABORATORY DATA: Lab Results  Component Value Date   WBC 6.5 07/16/2013   HGB 13.1 07/16/2013   HCT 39.9 07/16/2013   MCV 87.2 07/16/2013   PLT 248 07/16/2013      Chemistry      Component Value Date/Time   NA 142 07/16/2013 1341   NA 139 07/26/2012 0830   K 3.7 07/16/2013 1341   K 3.7 07/26/2012 0830   CL 104 07/26/2012 0830   CO2 27 07/16/2013 1341   CO2 29 07/26/2012 0830   BUN 22.6 07/16/2013 1341   BUN 23 07/26/2012 0830   CREATININE 0.9 07/16/2013 1341   CREATININE 0.74 07/26/2012 0830      Component Value Date/Time   CALCIUM 9.3 07/16/2013 1341   CALCIUM 9.6 07/26/2012 0830   ALKPHOS 61 07/16/2013 1341   ALKPHOS 55 07/26/2012 0830   AST 27 07/16/2013 1341   AST 13 07/26/2012 0830   ALT 21 07/16/2013 1341   ALT 11 07/26/2012 0830   BILITOT <0.20 07/16/2013 1341   BILITOT 0.3 07/26/2012 0830       RADIOGRAPHIC STUDIES:  No results found.  ASSESSMENT:  67 year old [place] woman: #1  On 08/11/12, she underwent a left breast lumpectomy with axillary sentinel lymph node biopsy by Dr. 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    #2  She completed radiation therapy in [place], [place] on 11/17/12.    #3  She started  Anastrozole 1 mg daily in 11/2012.   PLAN:  She is to continue taking Anastrozole as prescribed.  She is to follow up with She is advised to start taking Vitamin D3 2000 IU daily and continue weightbearing exercises. Proceed with prolia injection today. Riskas and benefits of prolia were discussed with the patient as well precautions when having dental procedures.  She will see me back in 6 months    All questions were answered. The patient knows to call the clinic with any problems, questions or concerns. We can certainly see the patient much sooner if necessary.  I spent 25 minutes counseling the patient face to face and . The total time spent in the appointment  was 30 minutes.  Drue Second, MD Medical/Oncology [facility] [phone] (beeper) [phone] (Office)   07/16/2013, 3:26 PM

## 2013-07-17 ENCOUNTER — Telehealth: Payer: Self-pay | Admitting: Oncology

## 2013-07-17 ENCOUNTER — Telehealth: Payer: Self-pay | Admitting: *Deleted

## 2013-07-17 DIAGNOSIS — C50419 Malignant neoplasm of upper-outer quadrant of unspecified female breast: Secondary | ICD-10-CM

## 2013-07-17 NOTE — Telephone Encounter (Signed)
, °

## 2013-07-17 NOTE — Telephone Encounter (Signed)
Patient needs a diagnostic mammogram.

## 2013-07-17 NOTE — Telephone Encounter (Signed)
Pt called states " I tried to make an appt for my mammogram and [facility] requesting clarification on type of mammo because I had a lumpectomy." Message forward to MD for Review.

## 2013-07-18 NOTE — Telephone Encounter (Signed)
Called [facility] , no order in their system at this time for a mammogram. Order submitted electronically.

## 2013-09-04 ENCOUNTER — Encounter (INDEPENDENT_AMBULATORY_CARE_PROVIDER_SITE_OTHER): Payer: Self-pay

## 2013-10-19 ENCOUNTER — Encounter (INDEPENDENT_AMBULATORY_CARE_PROVIDER_SITE_OTHER): Payer: Self-pay | Admitting: Surgery

## 2013-10-19 ENCOUNTER — Ambulatory Visit (INDEPENDENT_AMBULATORY_CARE_PROVIDER_SITE_OTHER): Payer: Medicare Other | Admitting: Surgery

## 2013-10-19 VITALS — BP 135/84 | HR 60 | Temp 98.2°F | Resp 14 | Ht 62.5 in | Wt 157.2 lb

## 2013-10-19 DIAGNOSIS — C50419 Malignant neoplasm of upper-outer quadrant of unspecified female breast: Secondary | ICD-10-CM

## 2013-10-19 DIAGNOSIS — C50412 Malignant neoplasm of upper-outer quadrant of left female breast: Secondary | ICD-10-CM

## 2013-10-19 NOTE — Progress Notes (Signed)
Re:   Kristina Curry DOB:   [birth year] MRN:   [id number]  [facility]  ASSESSMENT AND PLAN: 1.  Left breast cancer, T1c, N0.  3 o'clock.  Left breast lumpectomy 08/11/2012.  Final path - 1.7 cm, 0/1 nodes.  IDC, PR - 100%, PR - 100%, Her2Neu - neg, Ki67 - 17%  Oncotype DX - 18, Recurrence rate - 11%.  Dr. Welton Flakes Donnie Coffin) and Michell Heinrich are treating oncologist.   She had her radiation therapy in [facility] by Dr. Karlton Lemon.  On anastrozole.  Doing well.    To see me back in 6 months.   2.  BRCA 1/2 negative.   3.  Umbilical hernia. 4.  Takes nightly sleeping pill. 5.  Uses vaginal estrogen cream she is going to stop.  REFERRING PHYSICIAN:  Dr. Huey Romans  HISTORY OF PRESENT ILLNESS: Kristina Curry is a 67 y.o. (DOB: 12/19/1945)  white female whose primary care physician is Lorelei Pont, MD [place], [place]) and comes for follow up of left breast lumpectomy. She is with her husband.  She is doing well.  Has joint pain.  Does not know if it is the anastrozole or age.  She is a little grumpy (maybe her disposition).  She is reading "Gone Girl".  It is cold today and she is cold.  Breast History: She had a left breast biopsy on 07/18/2012 that showed IDC at [facility].  She has no prior history of breast biopsy or problems.  She has a strong fam hx of breast cancer.  Her mother got breast cancer at 75 yo (she died at 80 of metastatic disease).  Her father's sister had breast cancer (when she was older).  Her mother's sister had breast cancer (when she was older).  She has one sister who has no breast problems.  She comes from [place], [place].  She sees Medical laboratory scientific officer at [facility] in [place].   Past Medical History  Diagnosis Date  . Breast cancer   . Fatigue   . Ringing in ears   . Sinus complaint   . Dribbling of urine   . Skin cancer   . Back pain   . Headache(784.0)   . GERD (gastroesophageal reflux disease)   . Arthritis   . Osteoporosis, unspecified 07/16/2013    Past Surgical History   Procedure Laterality Date  . Tubal ligation  1982  . Eye surgery  1986    lt cataract  . Colonoscopy    . Breast lumpectomy  08/21/12      Current Outpatient Prescriptions  Medication Sig Dispense Refill  . acetaminophen (TYLENOL) 325 MG tablet Take 650 mg by mouth as needed.      . ALPRAZolam (XANAX) 0.5 MG tablet Take 0.5 mg by mouth at bedtime as needed.      . famotidine (PEPCID) 20 MG tablet Take 20 mg by mouth 2 (two) times daily as needed.      Marland Kitchen FLUoxetine (PROZAC) 10 MG tablet Take 10 mg by mouth daily.      Marland Kitchen HYDROcodone-acetaminophen (NORCO/VICODIN) 5-325 MG per tablet Take 1 tablet by mouth every 6 (six) hours as needed for pain.      Marland Kitchen zolpidem (AMBIEN) 10 MG tablet Take 10 mg by mouth at bedtime as needed.      Marland Kitchen anastrozole (ARIMIDEX) 1 MG tablet Take 1 tablet (1 mg total) by mouth daily.  90 tablet  4   No current facility-administered medications for this visit.     No Known  Allergies  REVIEW OF SYSTEMS: Pulmonary:  Smokes 1 to 2 cigarettes per day. Gastrointestinal:  GERD  No history of liver disease.  No history of gall bladder disease.  No history of pancreas disease.  No history of colon disease.  SOCIAL and FAMILY HISTORY: Married.  2 sons - 53 and 51. 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  This is supposed to be a part time job.  PHYSICAL EXAM: BP 135/84  Pulse 60  Temp(Src) 98.2 F (36.8 C) (Temporal)  Resp 14  Ht 5' 2.5" (1.588 m)  Wt 157 lb 3.2 oz (71.305 kg)  BMI 28.28 kg/m2  General: WN older WF who is alert and generally healthy appearing.  Neck: Supple. No mass.  No thyroid mass. Lymph Nodes:  Left axillary incision looks good.  No axillary, cervical, or supraclavicular nodes. Breast:  Left - Incision at 3 o'clock looks good.  Otherwise negative.  Right - negative.  No mass. Upper extremities:  No lymphedema.  DATA REVIEWED: Mammograms - August 2014 - negative.  Ovidio Kin, MD,  [facility]  [facility] [address]  [address]   [place], [place]    [address] Phone:  [phone] FAX:  [phone]

## 2014-01-17 ENCOUNTER — Ambulatory Visit: Payer: Medicare Other | Admitting: Oncology

## 2014-01-17 ENCOUNTER — Other Ambulatory Visit: Payer: Medicare Other | Admitting: Lab

## 2014-01-17 ENCOUNTER — Ambulatory Visit: Payer: Medicare Other

## 2014-01-18 ENCOUNTER — Other Ambulatory Visit (HOSPITAL_BASED_OUTPATIENT_CLINIC_OR_DEPARTMENT_OTHER): Payer: Medicare Other

## 2014-01-18 ENCOUNTER — Encounter: Payer: Self-pay | Admitting: Oncology

## 2014-01-18 ENCOUNTER — Other Ambulatory Visit: Payer: Self-pay | Admitting: *Deleted

## 2014-01-18 ENCOUNTER — Telehealth: Payer: Self-pay | Admitting: *Deleted

## 2014-01-18 ENCOUNTER — Ambulatory Visit (HOSPITAL_BASED_OUTPATIENT_CLINIC_OR_DEPARTMENT_OTHER): Payer: Medicare Other | Admitting: Oncology

## 2014-01-18 ENCOUNTER — Ambulatory Visit (HOSPITAL_BASED_OUTPATIENT_CLINIC_OR_DEPARTMENT_OTHER): Payer: Medicare Other

## 2014-01-18 VITALS — BP 106/72 | HR 60 | Temp 97.8°F | Resp 18 | Ht 62.0 in | Wt 157.9 lb

## 2014-01-18 DIAGNOSIS — M810 Age-related osteoporosis without current pathological fracture: Secondary | ICD-10-CM

## 2014-01-18 DIAGNOSIS — C50412 Malignant neoplasm of upper-outer quadrant of left female breast: Secondary | ICD-10-CM

## 2014-01-18 DIAGNOSIS — M8580 Other specified disorders of bone density and structure, unspecified site: Secondary | ICD-10-CM

## 2014-01-18 DIAGNOSIS — C50419 Malignant neoplasm of upper-outer quadrant of unspecified female breast: Secondary | ICD-10-CM

## 2014-01-18 DIAGNOSIS — Z170 Estrogen receptor positive status [ER+]: Secondary | ICD-10-CM

## 2014-01-18 DIAGNOSIS — C50919 Malignant neoplasm of unspecified site of unspecified female breast: Secondary | ICD-10-CM

## 2014-01-18 LAB — COMPREHENSIVE METABOLIC PANEL (CC13)
ALBUMIN: 3.9 g/dL (ref 3.5–5.0)
ALT: 11 U/L (ref 0–55)
ANION GAP: 8 meq/L (ref 3–11)
AST: 15 U/L (ref 5–34)
Alkaline Phosphatase: 49 U/L (ref 40–150)
BUN: 16.4 mg/dL (ref 7.0–26.0)
CALCIUM: 9.6 mg/dL (ref 8.4–10.4)
CHLORIDE: 107 meq/L (ref 98–109)
CO2: 26 mEq/L (ref 22–29)
CREATININE: 0.8 mg/dL (ref 0.6–1.1)
GLUCOSE: 61 mg/dL — AB (ref 70–140)
POTASSIUM: 4.3 meq/L (ref 3.5–5.1)
Sodium: 141 mEq/L (ref 136–145)
Total Bilirubin: 0.33 mg/dL (ref 0.20–1.20)
Total Protein: 7.3 g/dL (ref 6.4–8.3)

## 2014-01-18 LAB — CBC WITH DIFFERENTIAL/PLATELET
BASO%: 0.6 % (ref 0.0–2.0)
BASOS ABS: 0 10*3/uL (ref 0.0–0.1)
EOS ABS: 0.2 10*3/uL (ref 0.0–0.5)
EOS%: 4.8 % (ref 0.0–7.0)
HEMATOCRIT: 41 % (ref 34.8–46.6)
HEMOGLOBIN: 12.9 g/dL (ref 11.6–15.9)
LYMPH#: 1.1 10*3/uL (ref 0.9–3.3)
LYMPH%: 22 % (ref 14.0–49.7)
MCH: 28.3 pg (ref 25.1–34.0)
MCHC: 31.5 g/dL (ref 31.5–36.0)
MCV: 89.9 fL (ref 79.5–101.0)
MONO#: 0.7 10*3/uL (ref 0.1–0.9)
MONO%: 13.7 % (ref 0.0–14.0)
NEUT%: 58.9 % (ref 38.4–76.8)
NEUTROS ABS: 2.8 10*3/uL (ref 1.5–6.5)
Platelets: 245 10*3/uL (ref 145–400)
RBC: 4.56 10*6/uL (ref 3.70–5.45)
RDW: 14.6 % — AB (ref 11.2–14.5)
WBC: 4.8 10*3/uL (ref 3.9–10.3)

## 2014-01-18 MED ORDER — ANASTROZOLE 1 MG PO TABS: 1.0000 mg | ORAL_TABLET | Freq: Every day | ORAL | Status: DC

## 2014-01-18 MED ORDER — DENOSUMAB 60 MG/ML ~~LOC~~ SOLN
60.0000 mg | Freq: Once | SUBCUTANEOUS | Status: AC
  Administered 2014-01-18: 60 mg via SUBCUTANEOUS
  Filled 2014-01-18: qty 1

## 2014-01-18 NOTE — Patient Instructions (Signed)
Smoking Cessation Quitting smoking is important to your health and has many advantages. However, it is not always easy to quit since nicotine is a very addictive drug. Often times, people try 3 times or more before being able to quit. This document explains the best ways for you to prepare to quit smoking. Quitting takes hard work and a lot of effort, but you can do it. ADVANTAGES OF QUITTING SMOKING  You will live longer, feel better, and live better.  Your body will feel the impact of quitting smoking almost immediately.  Within 20 minutes, blood pressure decreases. Your pulse returns to its normal level.  After 8 hours, carbon monoxide levels in the blood return to normal. Your oxygen level increases.  After 24 hours, the chance of having a heart attack starts to decrease. Your breath, hair, and body stop smelling like smoke.  After 48 hours, damaged nerve endings begin to recover. Your sense of taste and smell improve.  After 72 hours, the body is virtually free of nicotine. Your bronchial tubes relax and breathing becomes easier.  After 2 to 12 weeks, lungs can hold more air. Exercise becomes easier and circulation improves.  The risk of having a heart attack, stroke, cancer, or lung disease is greatly reduced.  After 1 year, the risk of coronary heart disease is cut in half.  After 5 years, the risk of stroke falls to the same as a nonsmoker.  After 10 years, the risk of lung cancer is cut in half and the risk of other cancers decreases significantly.  After 15 years, the risk of coronary heart disease drops, usually to the level of a nonsmoker.  If you are pregnant, quitting smoking will improve your chances of having a healthy baby.  The people you live with, especially any children, will be healthier.  You will have extra money to spend on things other than cigarettes. QUESTIONS TO THINK ABOUT BEFORE ATTEMPTING TO QUIT You may want to talk about your answers with your  caregiver.  Why do you want to quit?  If you tried to quit in the past, what helped and what did not?  What will be the most difficult situations for you after you quit? How will you plan to handle them?  Who can help you through the tough times? Your family? Friends? A caregiver?  What pleasures do you get from smoking? What ways can you still get pleasure if you quit? Here are some questions to ask your caregiver:  How can you help me to be successful at quitting?  What medicine do you think would be best for me and how should I take it?  What should I do if I need more help?  What is smoking withdrawal like? How can I get information on withdrawal? GET READY  Set a quit date.  Change your environment by getting rid of all cigarettes, ashtrays, matches, and lighters in your home, car, or work. Do not let people smoke in your home.  Review your past attempts to quit. Think about what worked and what did not. GET SUPPORT AND ENCOURAGEMENT You have a better chance of being successful if you have help. You can get support in many ways.  Tell your family, friends, and co-workers that you are going to quit and need their support. Ask them not to smoke around you.  Get individual, group, or telephone counseling and support. Programs are available at local hospitals and health centers. Call your local health department for   information about programs in your area.  Spiritual beliefs and practices may help some smokers quit.  Download a "quit meter" on your computer to keep track of quit statistics, such as how long you have gone without smoking, cigarettes not smoked, and money saved.  Get a self-help book about quitting smoking and staying off of tobacco. LEARN NEW SKILLS AND BEHAVIORS  Distract yourself from urges to smoke. Talk to someone, go for a walk, or occupy your time with a task.  Change your normal routine. Take a different route to work. Drink tea instead of coffee.  Eat breakfast in a different place.  Reduce your stress. Take a hot bath, exercise, or read a book.  Plan something enjoyable to do every day. Reward yourself for not smoking.  Explore interactive web-based programs that specialize in helping you quit. GET MEDICINE AND USE IT CORRECTLY Medicines can help you stop smoking and decrease the urge to smoke. Combining medicine with the above behavioral methods and support can greatly increase your chances of successfully quitting smoking.  Nicotine replacement therapy helps deliver nicotine to your body without the negative effects and risks of smoking. Nicotine replacement therapy includes nicotine gum, lozenges, inhalers, nasal sprays, and skin patches. Some may be available over-the-counter and others require a prescription.  Antidepressant medicine helps people abstain from smoking, but how this works is unknown. This medicine is available by prescription.  Nicotinic receptor partial agonist medicine simulates the effect of nicotine in your brain. This medicine is available by prescription. Ask your caregiver for advice about which medicines to use and how to use them based on your health history. Your caregiver will tell you what side effects to look out for if you choose to be on a medicine or therapy. Carefully read the information on the package. Do not use any other product containing nicotine while using a nicotine replacement product.  RELAPSE OR DIFFICULT SITUATIONS Most relapses occur within the first 3 months after quitting. Do not be discouraged if you start smoking again. Remember, most people try several times before finally quitting. You may have symptoms of withdrawal because your body is used to nicotine. You may crave cigarettes, be irritable, feel very hungry, cough often, get headaches, or have difficulty concentrating. The withdrawal symptoms are only temporary. They are strongest when you first quit, but they will go away within  10 14 days. To reduce the chances of relapse, try to:  Avoid drinking alcohol. Drinking lowers your chances of successfully quitting.  Reduce the amount of caffeine you consume. Once you quit smoking, the amount of caffeine in your body increases and can give you symptoms, such as a rapid heartbeat, sweating, and anxiety.  Avoid smokers because they can make you want to smoke.  Do not let weight gain distract you. Many smokers will gain weight when they quit, usually less than 10 pounds. Eat a healthy diet and stay active. You can always lose the weight gained after you quit.  Find ways to improve your mood other than smoking. FOR MORE INFORMATION  www.smokefree.gov  Document Released: 11/16/2001 Document Revised: 05/23/2012 Document Reviewed: 03/02/2012 [employer] Patient Information 2014 [employer].  

## 2014-01-18 NOTE — Progress Notes (Signed)
OFFICE PROGRESS NOTE  CC  Combine, DO [place], [place] [address] Dr. Alphonsa Overall Dr. Thea Silversmith  DIAGNOSIS:  Kristina Curry is a 68 year old with T1N0 left breast cancer diagnosed in 2013   PRIOR THERAPY:    #1screening mammogram on 07/10/2012 that resulted abnormalities in the upper outer left breast.  Additional views were recommended and a biopsy was performed on 07/20/12 showing an invasive ductal carcinoma, grade 2, with DCIS present, ER+ 100%, PR+ 100%, Ki67 17%, HER2- with ratio of 1.67.  MRI resulted a solitary 1.6 cm mass within the lateral midportion of the left breast, consistent with known malignancy.  #2 On 08/11/12, she underwent a left breast lumpectomy with axillary sentinel lymph node biopsy by Dr. Lucia Gaskins.  Final pathology resulted IDC, grade 1 of 3, spanning 1.7 cm with DCIS low to intermediate grade with surgical margins negative for carcinoma.  There was no evidence of carcinoma in 1 of 1 lymph node.  She completed radiation therapy in [place], [place] on 11/17/12.  She started Anastrozole 1 mg daily in 11/2012. Therapy planned for 5 years.  CURRENT THERAPY: Curative intent Anastrozole 1 mg daily since 11/2012.  INTERVAL HISTORY: Rynn Markiewicz 68 y.o. female returns for continued follow up.  She has tolerated Anastrozole well with no hot flashes, mild joint pain that she relates to arthritis, and no vaginal dryness.  She reports intermittent right wrist pain that she thinks is carpal tunnel from overuse when working at [employer].  .  She states that she cannot tolerate calcium plus vitamin d supplementation due to moderate constipation.    MEDICAL HISTORY: Past Medical History  Diagnosis Date  . Breast cancer   . Fatigue   . Ringing in ears   . Sinus complaint   . Dribbling of urine   . Skin cancer   . Back pain   . Headache(784.0)   . GERD (gastroesophageal reflux disease)   . Arthritis   . Osteoporosis, unspecified 07/16/2013     ALLERGIES:  has No Known Allergies.  MEDICATIONS:  Current Outpatient Prescriptions  Medication Sig Dispense Refill  . ALPRAZolam (XANAX) 0.5 MG tablet Take 0.5 mg by mouth at bedtime as needed (Pt takes 1.5  tab).       Marland Kitchen anastrozole (ARIMIDEX) 1 MG tablet Take 1 tablet (1 mg total) by mouth daily.  90 tablet  4  . FLUoxetine (PROZAC) 10 MG tablet Take 10 mg by mouth daily.      Marland Kitchen acetaminophen (TYLENOL) 325 MG tablet Take 650 mg by mouth as needed.      . famotidine (PEPCID) 20 MG tablet Take 20 mg by mouth 2 (two) times daily as needed.      . zolpidem (AMBIEN) 10 MG tablet Take 10 mg by mouth at bedtime as needed.       No current facility-administered medications for this visit.    SURGICAL HISTORY:  Past Surgical History  Procedure Laterality Date  . Tubal ligation  1982  . Eye surgery  1986    lt cataract  . Colonoscopy    . Breast lumpectomy  08/21/12    REVIEW OF SYSTEMS:  Constitutional: Feels well.  Cardiovascular: No chest pain, shortness of breath, or edema.  Pulmonary: No cough or wheeze.  Gastrointestinal: No nausea, vomiting, or diarrhea. No bright red blood per rectum or change in bowel movement.  Genitourinary: No frequency, urgency, or dysuria. No vaginal bleeding or discharge.  Musculoskeletal: No myalgia.  Intermittent  right wrist pain. Neurologic: No weakness, numbness, or change in gait.  Psychology: No depression, anxiety, or insomnia  HEALTH MAINTENANCE: Mammogram: 07/2012 Colonoscopy: with Dr. Collene Mares, up to date Bone Density:  09/12/12 resulting osteopenia Pap Smear:  March 2013 with Laurin Coder, NP Eye Exam:  Yearly Vitamin D:  11/07/12 Lipid Panel:  Managed by Dr. Chauncey Reading  PHYSICAL EXAMINATION: Blood pressure 106/72, pulse 60, temperature 97.8 F (36.6 C), temperature source Oral, resp. rate 18, height '5\' 2"'  (1.575 m), weight 157 lb 14.4 oz (71.623 kg). Body mass index is 28.87 kg/(m^2). ECOG PERFORMANCE STATUS: 1 - Symptomatic but completely  ambulatory General: Well developed, well nourished female in no acute distress. Alert and oriented x 3.  Head/ Neck: Oropharynx clear.  Sclerae anicteric.  Supple without any enlargements.  Lymph node survey: No cervical, supraclavicular, or axillary adenopathy  Cardiovascular: Regular rate and rhythm. S1 and S2 normal.  Lungs: Clear to auscultation bilaterally. No wheezes/crackles/rhonchi noted.  Skin: No rashes or lesions present. Back: No CVA tenderness  Abdomen: Abdomen soft, non-tender and obese. Active bowel sounds in all quadrants. No evidence of a fluid wave or abdominal masses.  Breasts:  Inspection negative with no nodularity, erythema, masses, or discharge noted bilaterally.  Left lumpectomy incision well healed. Extremities: No bilateral cyanosis, edema, or clubbing.   LABORATORY DATA: Lab Results  Component Value Date   WBC 4.8 01/18/2014   HGB 12.9 01/18/2014   HCT 41.0 01/18/2014   MCV 89.9 01/18/2014   PLT 245 01/18/2014      Chemistry      Component Value Date/Time   NA 141 01/18/2014 0954   NA 139 07/26/2012 0830   K 4.3 01/18/2014 0954   K 3.7 07/26/2012 0830   CL 104 07/26/2012 0830   CO2 26 01/18/2014 0954   CO2 29 07/26/2012 0830   BUN 16.4 01/18/2014 0954   BUN 23 07/26/2012 0830   CREATININE 0.8 01/18/2014 0954   CREATININE 0.74 07/26/2012 0830      Component Value Date/Time   CALCIUM 9.6 01/18/2014 0954   CALCIUM 9.6 07/26/2012 0830   ALKPHOS 49 01/18/2014 0954   ALKPHOS 55 07/26/2012 0830   AST 15 01/18/2014 0954   AST 13 07/26/2012 0830   ALT 11 01/18/2014 0954   ALT 11 07/26/2012 0830   BILITOT 0.33 01/18/2014 0954   BILITOT 0.3 07/26/2012 0830       RADIOGRAPHIC STUDIES:  No results found.  ASSESSMENT:  68 year old [place] woman:  #1  , s/p left breast lumpectomy with axillary sentinel lymph node biopsy by Dr. Lucia Gaskins on09/06/13  for T1c N0 Stage I Grade 1 IDC with DCIS, ER+, PR+, HER2-, Ki67 17%.    #2  S/P radiation therapy in [place], [place] on 11/17/12.     #3  She started Anastrozole 1 mg daily in 11/2012. Patient is tolerating it well  PLAN:  She is to continue taking Anastrozole as prescribed.  She is to follow up with She is advised to start taking Vitamin D3 2000 IU daily and continue weightbearing exercises. Proceed with prolia injection today. Riskas and benefits of prolia were discussed with the patient as well precautions when having dental procedures.  She will see me back in 6 months with blood work and prolia injection  All questions were answered. The patient knows to call the clinic with any problems, questions or concerns. We can certainly see the patient much sooner if necessary.  I spent 20 minutes counseling the patient face  to face and . The total time spent in the appointment was 30 minutes.  Marcy Panning, MD Medical/Oncology [facility] [phone] (beeper) [phone] (Office)   01/18/2014, 10:57 AM

## 2014-01-18 NOTE — Telephone Encounter (Signed)
appt made and printed. appts made for [facility]...td

## 2014-01-20 ENCOUNTER — Encounter: Payer: Self-pay | Admitting: Oncology

## 2014-05-17 ENCOUNTER — Telehealth: Payer: Self-pay | Admitting: Oncology

## 2014-05-17 NOTE — Telephone Encounter (Signed)
, °

## 2014-05-20 ENCOUNTER — Telehealth: Payer: Self-pay | Admitting: Oncology

## 2014-05-20 NOTE — Telephone Encounter (Signed)
, °

## 2014-06-05 ENCOUNTER — Encounter (INDEPENDENT_AMBULATORY_CARE_PROVIDER_SITE_OTHER): Payer: Self-pay | Admitting: Surgery

## 2014-06-05 ENCOUNTER — Ambulatory Visit (INDEPENDENT_AMBULATORY_CARE_PROVIDER_SITE_OTHER): Payer: Medicare Other | Admitting: Surgery

## 2014-06-05 VITALS — BP 122/76 | HR 75 | Temp 97.1°F | Ht 62.0 in | Wt 156.0 lb

## 2014-06-05 DIAGNOSIS — C50412 Malignant neoplasm of upper-outer quadrant of left female breast: Secondary | ICD-10-CM

## 2014-06-05 DIAGNOSIS — C50419 Malignant neoplasm of upper-outer quadrant of unspecified female breast: Secondary | ICD-10-CM

## 2014-06-05 NOTE — Progress Notes (Signed)
Re:   Kristina Curry DOB:   08-31-1946 MRN:   [id number]  [place]  ASSESSMENT AND PLAN: 1.  Left breast cancer, T1c, N0.  3 o'clock.  Left breast lumpectomy 08/11/2012.  Final path - 1.7 cm, 0/1 nodes.  IDC, PR - 100%, PR - 100%, Her2Neu - neg, Ki67 - 17%  Oncotype DX - 18, Recurrence rate - 11%.  Dr. Marland Kitchen and Pablo Ledger are treating oncologist.   She had her radiation therapy in [facility] by Dr. Kellie Simmering.  She will be looking for her 3rd medical oncologist in 3 years.  On anastrozole.  Doing well.  She was in a little bit better mode today.  To see me back in 6 months.   2.  BRCA 1/2 negative.   3.  Umbilical hernia. 4.  Takes nightly sleeping pill. 5.  She is having some reflux - blamed on bone density med  REFERRING PHYSICIAN:  Dr. Andris Baumann  HISTORY OF PRESENT ILLNESS: Kristina Curry is a 68 y.o. (DOB: [birth year])  white female whose primary care physician is [name], MD [place], [place]) and comes for follow up of left breast lumpectomy. She comes by herself.  She is doing well.  No specific complaint.  She is for her mammogram in Sept, 2015.  Breast History: She had a left breast biopsy on 07/18/2012 that showed IDC at [facility].  She has no prior history of breast biopsy or problems.  She has a strong fam hx of breast cancer.  Her mother got breast cancer at 47 yo (she died at 60 of metastatic disease).  Her father's sister had breast cancer (when she was older).  Her mother's sister had breast cancer (when she was older).  She has one sister who has no breast problems.  She comes from [place], [place].  She sees Optician, dispensing at [facility] in [place].   Past Medical History  Diagnosis Date  . Breast cancer   . Fatigue   . Ringing in ears   . Sinus complaint   . Dribbling of urine   . Skin cancer   . Back pain   . Headache(784.0)   . GERD (gastroesophageal reflux disease)   . Arthritis   . Osteoporosis, unspecified 07/16/2013    Past Surgical History  Procedure  Laterality Date  . Tubal ligation  1982  . Eye surgery  1986    lt cataract  . Colonoscopy    . Breast lumpectomy  08/21/12      Current Outpatient Prescriptions  Medication Sig Dispense Refill  . acetaminophen (TYLENOL) 325 MG tablet Take 650 mg by mouth as needed.      . ALPRAZolam (XANAX) 0.5 MG tablet Take 0.5 mg by mouth at bedtime as needed (Pt takes 1.5  tab).       Marland Kitchen anastrozole (ARIMIDEX) 1 MG tablet Take 1 tablet (1 mg total) by mouth daily.  90 tablet  4  . denosumab (PROLIA) 60 MG/ML SOLN injection Inject 60 mg into the skin every 6 (six) months. Administer in upper arm, thigh, or abdomen      . famotidine (PEPCID) 20 MG tablet Take 20 mg by mouth 2 (two) times daily as needed.      Marland Kitchen FLUoxetine (PROZAC) 10 MG tablet Take 10 mg by mouth daily.      Marland Kitchen zolpidem (AMBIEN) 10 MG tablet Take 10 mg by mouth at bedtime as needed.       No current facility-administered medications for this visit.  No Known Allergies  REVIEW OF SYSTEMS: Pulmonary:  Smokes 1 to 2 cigarettes per day. Gastrointestinal:  GERD  No history of liver disease.  No history of gall bladder disease.  No history of pancreas disease.  No history of colon disease.  SOCIAL and FAMILY HISTORY: Married.  2 sons - 64 and 51. 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  This is supposed to be a part time job.  PHYSICAL EXAM: BP 122/76  Pulse 75  Temp(Src) 97.1 F (36.2 C)  Ht _0  (1.575 m)  Wt 156 lb (70.761 kg)  BMI 28.53 kg/m2  General: WN older WF who is alert and generally healthy appearing.  Neck: Supple. No mass.  No thyroid mass. Lymph Nodes:  Left axillary incision looks good.  No axillary, cervical, or supraclavicular nodes. Breast:  Left - Incision at 3 o'clock looks good.  Otherwise negative.  Right - negative.  No mass. Upper extremities:  No lymphedema.  DATA REVIEWED: Mammograms - August 2014 - negative.  Alphonsa Overall, MD,  [facility], [place]  [address]  [place], [place]    [place] Phone:  [phone] FAX:  [phone]

## 2014-06-26 ENCOUNTER — Telehealth: Payer: Self-pay | Admitting: *Deleted

## 2014-06-26 NOTE — Telephone Encounter (Signed)
Received faxed request for new script for anastrozole from [facility].  Spoke with patient.  She is not ready for it yet and will get new script when she sees Dr. Marko Plume  07-29-14.  Faxed back request with this information.

## 2014-07-28 ENCOUNTER — Other Ambulatory Visit: Payer: Self-pay | Admitting: Oncology

## 2014-07-28 DIAGNOSIS — M810 Age-related osteoporosis without current pathological fracture: Secondary | ICD-10-CM

## 2014-07-28 DIAGNOSIS — C50412 Malignant neoplasm of upper-outer quadrant of left female breast: Secondary | ICD-10-CM

## 2014-07-29 ENCOUNTER — Other Ambulatory Visit: Payer: Self-pay

## 2014-07-29 ENCOUNTER — Telehealth: Payer: Self-pay | Admitting: Oncology

## 2014-07-29 ENCOUNTER — Encounter: Payer: Self-pay | Admitting: Oncology

## 2014-07-29 ENCOUNTER — Other Ambulatory Visit (HOSPITAL_BASED_OUTPATIENT_CLINIC_OR_DEPARTMENT_OTHER): Payer: Medicare Other

## 2014-07-29 ENCOUNTER — Ambulatory Visit (HOSPITAL_BASED_OUTPATIENT_CLINIC_OR_DEPARTMENT_OTHER): Payer: Medicare Other | Admitting: Oncology

## 2014-07-29 ENCOUNTER — Ambulatory Visit: Payer: Medicare Other

## 2014-07-29 ENCOUNTER — Ambulatory Visit: Payer: Medicare Other | Admitting: Oncology

## 2014-07-29 VITALS — BP 108/64 | HR 75 | Temp 98.2°F | Resp 18 | Ht 62.0 in | Wt 159.1 lb

## 2014-07-29 DIAGNOSIS — Z170 Estrogen receptor positive status [ER+]: Secondary | ICD-10-CM

## 2014-07-29 DIAGNOSIS — F17200 Nicotine dependence, unspecified, uncomplicated: Secondary | ICD-10-CM

## 2014-07-29 DIAGNOSIS — C50419 Malignant neoplasm of upper-outer quadrant of unspecified female breast: Secondary | ICD-10-CM

## 2014-07-29 DIAGNOSIS — M810 Age-related osteoporosis without current pathological fracture: Secondary | ICD-10-CM

## 2014-07-29 DIAGNOSIS — M899 Disorder of bone, unspecified: Secondary | ICD-10-CM

## 2014-07-29 DIAGNOSIS — M949 Disorder of cartilage, unspecified: Secondary | ICD-10-CM

## 2014-07-29 DIAGNOSIS — K219 Gastro-esophageal reflux disease without esophagitis: Secondary | ICD-10-CM

## 2014-07-29 DIAGNOSIS — M8580 Other specified disorders of bone density and structure, unspecified site: Secondary | ICD-10-CM

## 2014-07-29 DIAGNOSIS — C50412 Malignant neoplasm of upper-outer quadrant of left female breast: Secondary | ICD-10-CM

## 2014-07-29 DIAGNOSIS — Z803 Family history of malignant neoplasm of breast: Secondary | ICD-10-CM

## 2014-07-29 LAB — CBC WITH DIFFERENTIAL/PLATELET
BASO%: 0.8 % (ref 0.0–2.0)
Basophils Absolute: 0.1 10*3/uL (ref 0.0–0.1)
EOS ABS: 0.2 10*3/uL (ref 0.0–0.5)
EOS%: 2.6 % (ref 0.0–7.0)
HCT: 40.8 % (ref 34.8–46.6)
HGB: 12.8 g/dL (ref 11.6–15.9)
LYMPH#: 1.7 10*3/uL (ref 0.9–3.3)
LYMPH%: 27.2 % (ref 14.0–49.7)
MCH: 28.4 pg (ref 25.1–34.0)
MCHC: 31.4 g/dL — AB (ref 31.5–36.0)
MCV: 90.7 fL (ref 79.5–101.0)
MONO#: 0.6 10*3/uL (ref 0.1–0.9)
MONO%: 9.8 % (ref 0.0–14.0)
NEUT%: 59.6 % (ref 38.4–76.8)
NEUTROS ABS: 3.7 10*3/uL (ref 1.5–6.5)
Platelets: 268 10*3/uL (ref 145–400)
RBC: 4.5 10*6/uL (ref 3.70–5.45)
RDW: 14.7 % — AB (ref 11.2–14.5)
WBC: 6.2 10*3/uL (ref 3.9–10.3)

## 2014-07-29 LAB — COMPREHENSIVE METABOLIC PANEL (CC13)
ALT: 9 U/L (ref 0–55)
ANION GAP: 7 meq/L (ref 3–11)
AST: 13 U/L (ref 5–34)
Albumin: 3.9 g/dL (ref 3.5–5.0)
Alkaline Phosphatase: 47 U/L (ref 40–150)
BILIRUBIN TOTAL: 0.21 mg/dL (ref 0.20–1.20)
BUN: 22.6 mg/dL (ref 7.0–26.0)
CO2: 28 meq/L (ref 22–29)
Calcium: 9.3 mg/dL (ref 8.4–10.4)
Chloride: 107 mEq/L (ref 98–109)
Creatinine: 0.9 mg/dL (ref 0.6–1.1)
Glucose: 92 mg/dl (ref 70–140)
POTASSIUM: 3.7 meq/L (ref 3.5–5.1)
SODIUM: 142 meq/L (ref 136–145)
TOTAL PROTEIN: 7.5 g/dL (ref 6.4–8.3)

## 2014-07-29 MED ORDER — ANASTROZOLE 1 MG PO TABS: 1.0000 mg | ORAL_TABLET | Freq: Every day | ORAL | Status: DC

## 2014-07-29 NOTE — Progress Notes (Signed)
OFFICE PROGRESS NOTE   07/29/2014   Physicians:Mark Mahoney (PCP, [place]), Alphonsa Overall, J.Palermo (RT Ledell Noss), Laurin Coder (NP [facility]), [place]  Eston Esters, Marcy Panning)  INTERVAL HISTORY:  Patient is seen, alone for visit and for first time by this MD, previously followed for Stage 1 breast cancer by Dr Truddie Coco and Dr Humphrey Rolls. She began Arimidex 11-2012,  after lumpectomy and radiation, which she is tolerating well. Last bilateral tomo mammograms were at [facility] 09-12-13; she is scheduled for repeat mammograms on 09-18-2014. Last DEXA was at [facility] 11-16-12 with lowest T score in femur -1.3, having had T scores -1.4 and -1.7 by prior DEXA 2010. She had Prolia in 01-2014, but wonders if she needs to continue this. She saw Dr Lucia Gaskins in 06-2014 and will see him again in 6 months; I do not believe that she is presently followed by radiation oncology.  Patient has felt generally well since she was here last in Feb 2015. She notices some mild joint pain in thumbs, knees, arms in AMs, better with activity. She has vaginal dryness. She is on Vit D 2000 u daily for some extended time, but is not using calcium supplement and did not tolerate oral bisphosphonate. She is not aware of any changes on breast self exam.  She does not have PAC I do not believe she has had genetics testing.   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 MRI showed the single area in mid left breast 1.6 cm. She had lumpectomy and axillary sentinel node evaluation by Dr Lucia Gaskins 08-11-2012, with final pathology 1.7 cm grade 1 with DCIS low to intermediate grade, margins negative and one sentinel node negative. She completed radiation in [facility] by Dr Orlene Erm on 11-17-12 and began anastrozole 11-2012. Oncotype Dx score 18 = risk of  recurrence 11%.  Review of systems as above, also: Constant tingling in left lower leg x a few months. GERD and diarrhea spring 2015, improved with interventions by Dr Collene Mares. No changes in bowels. No recent infectious illness. Denies SOB, change in cough but continues to smoke. Wakens early since years of working in [place]. Remainder of 10 point Review of Systems negative.  PMH/ PSH Colon polyps - up to date on colonoscopies by Dr Collene Mares Breast cancer as above BTL Cataract surgery   FH Mother with breast cancer at 62, died of metastatic disease at 11 Maternal aunt with breast cancer in older age Paternal aunt breast cancer in older age Sister healthy  Social: from [place], [place], lives with husband who is to have surgery for prostate cancer in Oct. Sons ages 30 and 69. 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 Still smoking 1-2 cigarettes daily at hs. She works out at gym 3x weekly, golfs 3x weekly and bowls.   Objective:  Vital signs in last 24 hours:  BP 108/64  Pulse 75  Temp(Src) 98.2 F (36.8 C) (Oral)  Resp 18  Ht '5\' 2"'  (1.575 m)  Wt 159 lb 1.6 oz (72.167 kg)  BMI 29.09 kg/m2  SpO2 100% weight is up 1 lb  Alert, oriented and appropriate, very pleasant and good historian, looks comfortable. Ambulatory without difficulty.  Normal hair pattern  HEENT:PERRL, sclerae not icteric. Oral mucosa moist without lesions, posterior pharynx clear.  Neck supple. No JVD.  Lymphatics:no cervical,supraclavicular, axillary or inguinal adenopathy  Resp: clear to auscultation bilaterally and normal percussion bilaterally Cardio: regular rate and rhythm. No gallop. GI: soft, nontender, not distended, no mass or organomegaly. Normally active bowel sounds.  Musculoskeletal/ Extremities: without pitting edema, cords, tenderness UE or LE Neuro: decreased light touch left foot and distal 1/3 lower leg. PSYCH normal mood and affect Skin without rash, ecchymosis, petechiae Breasts: left lumpectomy scar  well healed, breasts bilaterally without dominant mass, skin or nipple findings. Axillae benign.   Lab Results:  Results for orders placed in visit on 07/29/14  CBC WITH DIFFERENTIAL      Result Value Ref Range   WBC 6.2  3.9 - 10.3 10e3/uL   NEUT# 3.7  1.5 - 6.5 10e3/uL   HGB 12.8  11.6 - 15.9 g/dL   HCT 40.8  34.8 - 46.6 %   Platelets 268  145 - 400 10e3/uL   MCV 90.7  79.5 - 101.0 fL   MCH 28.4  25.1 - 34.0 pg   MCHC 31.4 (*) 31.5 - 36.0 g/dL   RBC 4.50  3.70 - 5.45 10e6/uL   RDW 14.7 (*) 11.2 - 14.5 %   lymph# 1.7  0.9 - 3.3 10e3/uL   MONO# 0.6  0.1 - 0.9 10e3/uL   Eosinophils Absolute 0.2  0.0 - 0.5 10e3/uL   Basophils Absolute 0.1  0.0 - 0.1 10e3/uL   NEUT% 59.6  38.4 - 76.8 %   LYMPH% 27.2  14.0 - 49.7 %   MONO% 9.8  0.0 - 14.0 %   EOS% 2.6  0.0 - 7.0 %   BASO% 0.8  0.0 - 2.0 %  COMPREHENSIVE METABOLIC PANEL (UX32)      Result Value Ref Range   Sodium 142  136 - 145 mEq/L   Potassium 3.7  3.5 - 5.1 mEq/L   Chloride 107  98 - 109 mEq/L   CO2 28  22 - 29 mEq/L   Glucose 92  70 - 140 mg/dl   BUN 22.6  7.0 - 26.0 mg/dL   Creatinine 0.9  0.6 - 1.1 mg/dL   Total Bilirubin 0.21  0.20 - 1.20 mg/dL   Alkaline Phosphatase 47  40 - 150 U/L   AST 13  5 - 34 U/L   ALT 9  0 - 55 U/L   Total Protein 7.5  6.4 - 8.3 g/dL   Albumin 3.9  3.5 - 5.0 g/dL   Calcium 9.3  8.4 - 10.4 mg/dL   Anion Gap 7  3 - 11 mEq/L     Studies/Results Most recent mammograms (09-12-13) and DEXA 11-16-2012 requested from [facility] at time of chart review prior to visit, and available for visit, will be scanned into this EMR. Results as noted above  Medications: I have reviewed the patient's current medications: Discussed need for optimal calcium in diet or supplements, whether or not she continues Prolia. Since bone density scan will be done in Oct and since she agrees to beginning calcium, will wait for bone density information before deciding about Prolia. Will check Vitamin D level with next blood drawn  at this office if not done prior by PCP or gyn.  DISCUSSION: history as above reviewed with patient. Discussed mechanism of action of Anastrozole and possible side effects of nonpermanent arthralgias and of decrease in bone density. Discussed need for optimal calcium/D and weight bearing exercise.  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 She will have bilateral mammograms at [facility] in October, and needs bone density scan at [facility] or at least 11-2014 (medically necessary with high risk aromatase inhibitor, so should be able to have this 09-2014). I will see her back in 6 months or sooner if needed. She will keep appointment with Dr Lucia Gaskins ~ Dec. 2.osteopenia: prefers not to have Prolia now. Will increase calcium to 1200 - 1500 mg daily, continue good weight bearing exercise and will follow up bone density scan upcoming. Will check Vit D with next labs here if not done prior by PCP 3.ongoing tobacco: discussed and strongly encouraged her to stop entirely 4. GERD 5.hx colon polyps, up to date on colonoscopy 6.tingling left foot and leg: to discuss with PCP, as she may need vascular evaluation. Stop smoking   Time spent 35 min including >50% counseling and coordination of care   LIVESAY,LENNIS P, MD   07/29/2014, 3:28 PM

## 2014-07-29 NOTE — Telephone Encounter (Signed)
per pof to sh pt appt-LL sch not set up for Feb-adv pt we would call w/appt-CX inj per LL

## 2014-07-30 LAB — VITAMIN D 25 HYDROXY (VIT D DEFICIENCY, FRACTURES): Vit D, 25-Hydroxy: 70 ng/mL (ref 30–89)

## 2014-08-04 ENCOUNTER — Other Ambulatory Visit: Payer: Self-pay | Admitting: Oncology

## 2014-08-04 DIAGNOSIS — C50412 Malignant neoplasm of upper-outer quadrant of left female breast: Secondary | ICD-10-CM

## 2014-08-04 DIAGNOSIS — M8580 Other specified disorders of bone density and structure, unspecified site: Secondary | ICD-10-CM

## 2014-08-08 ENCOUNTER — Telehealth: Payer: Self-pay | Admitting: *Deleted

## 2014-08-08 NOTE — Telephone Encounter (Signed)
Message copied by Christa See on Thu Aug 08, 2014 11:22 AM ------      Message from: Gordy Levan      Created: Thu Aug 08, 2014  8:41 AM       Labs seen and need follow up: please let her know that lab did run Vit D level last week, which is in good range at 70. She should still take calcium +D tablet twice daily (or one daily + calcium in diet to get 1200-1500 mg daily calcium), but does not need extra higher doses of D ------

## 2014-08-08 NOTE — Telephone Encounter (Signed)
Called patient as noted below by Dr. Marko Plume. Patient agreeable to continue taking calcium + D tablets twice a day.

## 2014-08-13 ENCOUNTER — Telehealth: Payer: Self-pay | Admitting: Oncology

## 2014-08-13 NOTE — Telephone Encounter (Signed)
pt was already sched for 10.14.15 for mammo and bone density...@ 2pm at [facility]

## 2014-09-21 ENCOUNTER — Telehealth: Payer: Self-pay | Admitting: Oncology

## 2014-09-21 NOTE — Telephone Encounter (Signed)
, °

## 2014-11-09 ENCOUNTER — Telehealth: Payer: Self-pay | Admitting: Oncology

## 2014-11-09 NOTE — Telephone Encounter (Signed)
s.w. pt and advised on appt change....pt ok and aware of new d.t

## 2015-01-22 ENCOUNTER — Other Ambulatory Visit: Payer: Medicare Other

## 2015-01-22 ENCOUNTER — Ambulatory Visit: Payer: Medicare Other | Admitting: Oncology

## 2015-01-23 ENCOUNTER — Other Ambulatory Visit: Payer: Medicare Other

## 2015-01-23 ENCOUNTER — Ambulatory Visit: Payer: Medicare Other | Admitting: Oncology

## 2015-01-24 ENCOUNTER — Other Ambulatory Visit: Payer: Self-pay | Admitting: *Deleted

## 2015-01-24 DIAGNOSIS — C50419 Malignant neoplasm of upper-outer quadrant of unspecified female breast: Secondary | ICD-10-CM

## 2015-01-26 ENCOUNTER — Other Ambulatory Visit: Payer: Self-pay | Admitting: Oncology

## 2015-01-27 ENCOUNTER — Telehealth: Payer: Self-pay | Admitting: Oncology

## 2015-01-27 ENCOUNTER — Other Ambulatory Visit (HOSPITAL_BASED_OUTPATIENT_CLINIC_OR_DEPARTMENT_OTHER): Payer: Medicare Other

## 2015-01-27 ENCOUNTER — Ambulatory Visit (HOSPITAL_BASED_OUTPATIENT_CLINIC_OR_DEPARTMENT_OTHER): Payer: Medicare Other | Admitting: Oncology

## 2015-01-27 ENCOUNTER — Encounter: Payer: Self-pay | Admitting: Oncology

## 2015-01-27 VITALS — BP 102/64 | HR 64 | Temp 98.1°F | Resp 18 | Ht 62.0 in | Wt 166.6 lb

## 2015-01-27 DIAGNOSIS — C50419 Malignant neoplasm of upper-outer quadrant of unspecified female breast: Secondary | ICD-10-CM

## 2015-01-27 DIAGNOSIS — C50412 Malignant neoplasm of upper-outer quadrant of left female breast: Secondary | ICD-10-CM

## 2015-01-27 DIAGNOSIS — D0592 Unspecified type of carcinoma in situ of left breast: Secondary | ICD-10-CM

## 2015-01-27 DIAGNOSIS — M899 Disorder of bone, unspecified: Secondary | ICD-10-CM

## 2015-01-27 DIAGNOSIS — M8580 Other specified disorders of bone density and structure, unspecified site: Secondary | ICD-10-CM

## 2015-01-27 DIAGNOSIS — Z720 Tobacco use: Secondary | ICD-10-CM

## 2015-01-27 LAB — CBC WITH DIFFERENTIAL/PLATELET
BASO%: 1.1 % (ref 0.0–2.0)
BASOS ABS: 0.1 10*3/uL (ref 0.0–0.1)
EOS%: 2.4 % (ref 0.0–7.0)
Eosinophils Absolute: 0.1 10*3/uL (ref 0.0–0.5)
HEMATOCRIT: 39.1 % (ref 34.8–46.6)
HGB: 12.3 g/dL (ref 11.6–15.9)
LYMPH#: 1.5 10*3/uL (ref 0.9–3.3)
LYMPH%: 28.7 % (ref 14.0–49.7)
MCH: 27.7 pg (ref 25.1–34.0)
MCHC: 31.5 g/dL (ref 31.5–36.0)
MCV: 87.7 fL (ref 79.5–101.0)
MONO#: 0.6 10*3/uL (ref 0.1–0.9)
MONO%: 12.3 % (ref 0.0–14.0)
NEUT#: 2.8 10*3/uL (ref 1.5–6.5)
NEUT%: 55.5 % (ref 38.4–76.8)
PLATELETS: 257 10*3/uL (ref 145–400)
RBC: 4.46 10*6/uL (ref 3.70–5.45)
RDW: 14.4 % (ref 11.2–14.5)
WBC: 5.1 10*3/uL (ref 3.9–10.3)

## 2015-01-27 LAB — COMPREHENSIVE METABOLIC PANEL (CC13)
ALBUMIN: 3.6 g/dL (ref 3.5–5.0)
ALT: 8 U/L (ref 0–55)
ANION GAP: 8 meq/L (ref 3–11)
AST: 13 U/L (ref 5–34)
Alkaline Phosphatase: 53 U/L (ref 40–150)
BUN: 17 mg/dL (ref 7.0–26.0)
CALCIUM: 9.3 mg/dL (ref 8.4–10.4)
CO2: 27 mEq/L (ref 22–29)
CREATININE: 0.8 mg/dL (ref 0.6–1.1)
Chloride: 106 mEq/L (ref 98–109)
EGFR: 71 mL/min/{1.73_m2} — AB (ref >90)
Glucose: 100 mg/dl (ref 70–140)
Potassium: 4.1 mEq/L (ref 3.5–5.1)
Sodium: 141 mEq/L (ref 136–145)
Total Bilirubin: 0.23 mg/dL (ref 0.20–1.20)
Total Protein: 6.7 g/dL (ref 6.4–8.3)

## 2015-01-27 NOTE — Telephone Encounter (Signed)
per pof to sch pt appt-gave pt copy of sch °

## 2015-01-27 NOTE — Progress Notes (Signed)
OFFICE PROGRESS NOTE   January 27, 2015   Physicians:Mark Chauncey Reading (PCP, [place]), Alphonsa Overall, J.Palermo (RT Ledell Noss), Laurin Coder (NP [facility]), Jyothi Mann Eston Esters, Kalsoom Humphrey Rolls), _Szulecki (derm)  INTERVAL HISTORY:  Patient is seen, alone for visit, as she continues adjuvant Arimidex for stage 1 left breast cancer, this begun in 11-2012 and planned x 5 years. She is tolerating the Arimidex well, with DEXA at [facility] 09-18-14 unchanged from 2013 with minimal osteopenia. Last bilateral tomo mammograms were also at [facility] 09-18-14, with scattered densities and no mammographic findings of concern.  Patient has felt well since she was here last, and has not noticed any changes in breasts on self exam. She works out at gym or at home 5 days weekly including weights and cardio. She is concerned about pushing calcium due to recent reports that this may have adverse vascular effects.   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 MRI showed the single area in mid left breast 1.6 cm. She had lumpectomy and axillary sentinel node evaluation by Dr Lucia Gaskins 08-11-2012, with final pathology 1.7 cm grade 1 with DCIS low to intermediate grade, margins negative and one sentinel node negative. She completed radiation in [facility] by Dr Orlene Erm on 11-17-12 and began anastrozole 11-2012. Oncotype Dx score 18 = risk of recurrence 11%.   Review of systems as above, also: No respiratory, GI or musculoskeletal problems. No bleeding. Good energy, good appetite.  Remainder of 10 point Review of Systems negative.  Objective:  Vital signs in last 24 hours:  BP 102/64 mmHg  Pulse 64  Temp(Src) 98.1 F (36.7 C) (Oral)  Resp 18  Ht _0  (1.575 m)  Wt  166 lb 9.6 oz (75.569 kg)  BMI 30.46 kg/m2 Weight up 7 lbs. Alert, oriented and appropriate. Ambulatory without difficulty.  Alopecia  HEENT:PERRL, sclerae not icteric. Oral mucosa moist without lesions, posterior pharynx clear.  Neck supple. No JVD.  Lymphatics:no cervical,supraclavicular, axillary adenopathy Resp: clear to auscultation bilaterally and normal percussion bilaterally Cardio: regular rate and rhythm. No gallop. GI: soft, nontender, not distended, no mass or organomegaly. Normally active bowel sounds.  Musculoskeletal/ Extremities: without pitting edema, cords, tenderness Neuro: nonfocal   PSYCH appropriate mood and affect Skin without rash, ecchymosis, petechiae Breasts:left lumpectomy scar well healed and otherwise bilaterally without dominant mass, skin or nipple findings. Axillae benign.   Lab Results:  Results for orders placed or performed in visit on 01/27/15  CBC with Differential  Result Value Ref Range   WBC 5.1 3.9 - 10.3 10e3/uL   NEUT# 2.8 1.5 - 6.5 10e3/uL   HGB 12.3 11.6 - 15.9 g/dL   HCT 39.1 34.8 - 46.6 %   Platelets 257 145 - 400 10e3/uL   MCV 87.7 79.5 - 101.0 fL   MCH 27.7 25.1 - 34.0 pg   MCHC 31.5 31.5 - 36.0 g/dL   RBC 4.46 3.70 - 5.45 10e6/uL   RDW 14.4 11.2 - 14.5 %   lymph# 1.5 0.9 - 3.3 10e3/uL   MONO# 0.6 0.1 - 0.9 10e3/uL   Eosinophils Absolute 0.1 0.0 - 0.5 10e3/uL   Basophils Absolute 0.1 0.0 - 0.1 10e3/uL   NEUT% 55.5 38.4 - 76.8 %   LYMPH% 28.7  14.0 - 49.7 %   MONO% 12.3 0.0 - 14.0 %   EOS% 2.4 0.0 - 7.0 %   BASO% 1.1 0.0 - 2.0 %  Comprehensive metabolic panel (Cmet) - [facility]  Result Value Ref Range   Sodium 141 136 - 145 mEq/L   Potassium 4.1 3.5 - 5.1 mEq/L   Chloride 106 98 - 109 mEq/L   CO2 27 22 - 29 mEq/L   Glucose 100 70 - 140 mg/dl   BUN 17.0 7.0 - 26.0 mg/dL   Creatinine 0.8 0.6 - 1.1 mg/dL   Total Bilirubin 0.23 0.20 - 1.20 mg/dL   Alkaline Phosphatase 53 40 - 150 U/L   AST 13 5 - 34 U/L   ALT 8 0 - 55 U/L    Total Protein 6.7 6.4 - 8.3 g/dL   Albumin 3.6 3.5 - 5.0 g/dL   Calcium 9.3 8.4 - 10.4 mg/dL   Anion Gap 8 3 - 11 mEq/L   EGFR 71 (L) >90 ml/min/1.73 m2     Studies/Results: Bilateral tomo mammography [facility] 09-18-14: report reviewed, scanned into this EMR  DEXA [facility] 09-18-14: report reviewed, scanned into this EMR. Lowest T score -1.3 in right femoral neck stable from 2013, slight decrease in LS compared with 2013, but still only -0.7.  Bone denisty scan results discussed as above.  Medications: I have reviewed the patient's current medications. Continue Arimidex. Calcium supplements as noted  DISCUSSION: Recent information in press re concerns with calcium supplementation, not clear if this is actually a concern. I have encouraged patient to try to get 1000 - 1200 mg calcium in diet or supplement daily and especially to continue good weight bearing exercise as she is doing.  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 She will have bilateral mammograms again in Oct. I will see her back in 6 months or sooner if needed. She will keep follow up with Dr Lucia Gaskins as scheduled. 2.osteopenia: bone density scan 09-2014 [facility] stable from 2013. Will try to continue calcium ~1200  mg daily, continue good weight bearing exercise. Vit D was 70 in 07-2014 3.ongoing tobacco tho minimal now: strongly encouraged her to stop entirely 4. GERD no increased symptoms 5.hx colon polyps, up to date on colonoscopy   All questions answered and she is in agreement with recommendations and plan as above. Time spent 25 min including >50% counseling and coordination of care.  Viktoria Gruetzmacher P, MD   01/27/2015, 1:49 PM

## 2015-01-28 ENCOUNTER — Telehealth: Payer: Self-pay

## 2015-01-28 NOTE — Telephone Encounter (Signed)
-----   Message from Gordy Levan, MD sent at 01/27/2015  8:49 PM EST ----- Labs seen and need follow up: please let her know labs all looked good

## 2015-01-28 NOTE — Telephone Encounter (Signed)
Told Ms. Kristina Curry the results of the labs from 01-27-15 as noted below by Dr. Marko Plume.  Patient verbalized understanding.

## 2015-01-29 DIAGNOSIS — Z720 Tobacco use: Secondary | ICD-10-CM | POA: Insufficient documentation

## 2015-07-27 ENCOUNTER — Other Ambulatory Visit: Payer: Self-pay | Admitting: Oncology

## 2015-07-27 DIAGNOSIS — C50412 Malignant neoplasm of upper-outer quadrant of left female breast: Secondary | ICD-10-CM

## 2015-07-28 ENCOUNTER — Encounter: Payer: Self-pay | Admitting: Oncology

## 2015-07-28 ENCOUNTER — Telehealth: Payer: Self-pay | Admitting: Oncology

## 2015-07-28 ENCOUNTER — Ambulatory Visit (HOSPITAL_BASED_OUTPATIENT_CLINIC_OR_DEPARTMENT_OTHER): Payer: Medicare Other | Admitting: Oncology

## 2015-07-28 ENCOUNTER — Other Ambulatory Visit: Payer: Self-pay | Admitting: Oncology

## 2015-07-28 ENCOUNTER — Other Ambulatory Visit (HOSPITAL_BASED_OUTPATIENT_CLINIC_OR_DEPARTMENT_OTHER): Payer: Medicare Other

## 2015-07-28 VITALS — BP 125/74 | HR 84 | Temp 98.4°F | Resp 18 | Ht 62.0 in | Wt 173.8 lb

## 2015-07-28 DIAGNOSIS — M899 Disorder of bone, unspecified: Secondary | ICD-10-CM | POA: Diagnosis not present

## 2015-07-28 DIAGNOSIS — C50412 Malignant neoplasm of upper-outer quadrant of left female breast: Secondary | ICD-10-CM | POA: Diagnosis present

## 2015-07-28 DIAGNOSIS — E2839 Other primary ovarian failure: Secondary | ICD-10-CM | POA: Diagnosis not present

## 2015-07-28 DIAGNOSIS — Z853 Personal history of malignant neoplasm of breast: Secondary | ICD-10-CM

## 2015-07-28 DIAGNOSIS — Z1231 Encounter for screening mammogram for malignant neoplasm of breast: Secondary | ICD-10-CM

## 2015-07-28 DIAGNOSIS — Z87891 Personal history of nicotine dependence: Secondary | ICD-10-CM

## 2015-07-28 LAB — COMPREHENSIVE METABOLIC PANEL (CC13)
ALK PHOS: 57 U/L (ref 40–150)
ALT: 13 U/L (ref 0–55)
AST: 16 U/L (ref 5–34)
Albumin: 3.9 g/dL (ref 3.5–5.0)
Anion Gap: 10 mEq/L (ref 3–11)
BILIRUBIN TOTAL: 0.25 mg/dL (ref 0.20–1.20)
BUN: 22.2 mg/dL (ref 7.0–26.0)
CO2: 23 mEq/L (ref 22–29)
Calcium: 9.6 mg/dL (ref 8.4–10.4)
Chloride: 107 mEq/L (ref 98–109)
Creatinine: 0.9 mg/dL (ref 0.6–1.1)
EGFR: 65 mL/min/{1.73_m2} — ABNORMAL LOW (ref >90)
GLUCOSE: 131 mg/dL (ref 70–140)
POTASSIUM: 3.9 meq/L (ref 3.5–5.1)
Sodium: 140 mEq/L (ref 136–145)
Total Protein: 7.2 g/dL (ref 6.4–8.3)

## 2015-07-28 NOTE — Progress Notes (Signed)
OFFICE PROGRESS NOTE   July 28, 2015   Physicians:Mark Chauncey Reading (PCP, [place]), Alphonsa Overall, J.Palermo (RT Ledell Noss), Laurin Coder (NP [facility]), Jyothi Mann Eston Esters, Kalsoom Humphrey Rolls), _Szulecki (derm)  INTERVAL HISTORY:  Patient is seen, together with husband, in scheduled 6 month follow up of stage 1 left breast cancer, for which she has been on Arimidex since 11-2012, planned x 5 years. Most recent bilateral mammograms were at [facility] 09-18-14 and DEXA also 09-18-14.   Patient has felt well since she was here last. She is active, including gym regularly, golfing and bowling. She has noticed no changes in breasts bilaterally. 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 MRI showed the single area in mid left breast 1.6 cm. She had lumpectomy and axillary sentinel node evaluation by Dr Lucia Gaskins 08-11-2012, with final pathology 1.7 cm grade 1 with DCIS low to intermediate grade, margins negative and one sentinel node negative. She completed radiation in [facility] by Dr Orlene Erm on 11-17-12 and began anastrozole 11-2012. Oncotype Dx score 18 = risk of recurrence 11%.    Review of systems as above, also: Recent low back pain which has been intermittent problem x years, resolved with injection by PCP. No other new or different pain. No recent infectious illness. No SOB or other respiratory symptoms. No bleeding. No abdominal or pelvic pain. No LE swelling. Good energy and appetite Remainder of 10 point Review of Systems negative.  Objective:  Vital signs in last 24 hours:  BP 125/74 mmHg  Pulse 84  Temp(Src) 98.4 F (36.9 C) (Oral)  Resp 18  Ht '5\' 2"'  (1.575 m)  Wt 173 lb 12.8 oz (78.835 kg)  BMI 31.78 kg/m2  SpO2  98% Weight up 7 lbs Alert, oriented and appropriate. Ambulatory without difficulty.   HEENT:PERRL, sclerae not icteric. Oral mucosa moist without lesions, posterior pharynx clear.  Neck supple. No JVD.  Lymphatics:no cervical,supraclavicular, axillary adenopathy Resp: clear to auscultation bilaterally and normal percussion bilaterally Cardio: regular rate and rhythm. No gallop. GI: soft, nontender, not distended, no mass or organomegaly. Normally active bowel sounds.  Musculoskeletal/ Extremities: without pitting edema, cords, tenderness Neuro:  nonfocal  PSYCH appropriate mood and affect Skin without rash, ecchymosis, petechiae Breasts: well healed left lumpectomy scar without evidence of local recurrence, otherwise without dominant mass, skin or nipple findings. Axillae benign.   Lab Results:  Results for orders placed or performed in visit on 07/28/15  Comprehensive metabolic panel (Cmet) - [facility]  Result Value Ref Range   Sodium 140 136 - 145 mEq/L   Potassium 3.9 3.5 - 5.1 mEq/L   Chloride 107 98 - 109 mEq/L   CO2 23 22 - 29 mEq/L   Glucose 131 70 - 140 mg/dl   BUN 22.2 7.0 - 26.0 mg/dL   Creatinine 0.9 0.6 - 1.1 mg/dL   Total Bilirubin 0.25 0.20 - 1.20 mg/dL   Alkaline Phosphatase 57 40 - 150 U/L   AST 16 5 - 34 U/L   ALT 13 0 - 55 U/L   Total Protein 7.2 6.4 - 8.3 g/dL   Albumin 3.9 3.5 - 5.0 g/dL   Calcium 9.6 8.4 - 10.4 mg/dL   Anion Gap 10 3 - 11 mEq/L   EGFR 65 (L) >  90 ml/min/1.73 m2     Studies/Results:  No results found.  Medications: I have reviewed the patient's current medications.  DISCUSSION: reviewed arthralgia complications from aromatase inhibitors. Not clear if her mild symptoms are chronic arthritis or AI arthralgias, tho if more bothersome could do 1-2 week break from AI to see if improves.  Note bone density scan 09-2014 with osteopenia. Would be medically appropriate on the chronic high risk aromatase inhibitor to repeat yearly if possible.    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 She will have bilateral mammograms again in Oct. I will see her back in 6 months or sooner if needed. She will keep yearly follow up with Dr Lucia Gaskins as scheduled. 2.osteopenia: bone density scan 09-2014 [facility] stable from 2013. Will try to continue calcium ~1200 mg daily, continue good weight bearing exercise. Vit D was 70 in 07-2014 3.Cigarettes DCd ~ Oct 2015. Encouraged her to continue nonsmoking. 4. GERD no increased symptoms 5.hx colon polyps, up to date on colonoscopy   All questions answered. Time spent 15 min including >50% counseling and coordination of care. Cc PCP and Dr Debby Freiberg, MD   07/28/2015, 3:18 PM

## 2015-07-28 NOTE — Telephone Encounter (Signed)
Per 8/22 pof lab/LL 6 mos - February 2017. Per patient she will contact [facility] for mammo appointment as she schedules appointment with a buddy. Patient aware she will be contacted re lab/LL schedule not yet open.

## 2015-07-29 DIAGNOSIS — Z87891 Personal history of nicotine dependence: Secondary | ICD-10-CM | POA: Insufficient documentation

## 2015-07-29 DIAGNOSIS — E2839 Other primary ovarian failure: Secondary | ICD-10-CM | POA: Insufficient documentation

## 2015-07-29 DIAGNOSIS — Z1231 Encounter for screening mammogram for malignant neoplasm of breast: Secondary | ICD-10-CM | POA: Insufficient documentation

## 2015-07-30 ENCOUNTER — Other Ambulatory Visit: Payer: Self-pay | Admitting: Oncology

## 2015-07-31 ENCOUNTER — Telehealth: Payer: Self-pay | Admitting: *Deleted

## 2015-07-31 NOTE — Telephone Encounter (Signed)
Left VM for patient that refill has been sent to her pharmacy for Arimidex tablets. Requested return call if patient had any additional questions or concerns.

## 2015-10-03 ENCOUNTER — Telehealth: Payer: Self-pay | Admitting: Oncology

## 2015-10-03 NOTE — Telephone Encounter (Signed)
Patient called in to make her 6 month dr Marko Plume appointment

## 2016-01-26 ENCOUNTER — Ambulatory Visit: Payer: Medicare Other | Admitting: Oncology

## 2016-01-26 ENCOUNTER — Other Ambulatory Visit: Payer: Medicare Other

## 2016-02-01 ENCOUNTER — Other Ambulatory Visit: Payer: Self-pay | Admitting: Oncology

## 2016-02-01 DIAGNOSIS — C50412 Malignant neoplasm of upper-outer quadrant of left female breast: Secondary | ICD-10-CM

## 2016-02-05 ENCOUNTER — Encounter: Payer: Self-pay | Admitting: Oncology

## 2016-02-05 ENCOUNTER — Other Ambulatory Visit (HOSPITAL_BASED_OUTPATIENT_CLINIC_OR_DEPARTMENT_OTHER): Payer: Medicare Other

## 2016-02-05 ENCOUNTER — Ambulatory Visit (HOSPITAL_BASED_OUTPATIENT_CLINIC_OR_DEPARTMENT_OTHER): Payer: Medicare Other | Admitting: Oncology

## 2016-02-05 ENCOUNTER — Telehealth: Payer: Self-pay | Admitting: Oncology

## 2016-02-05 VITALS — BP 111/70 | HR 76 | Temp 98.0°F | Resp 18 | Ht 62.0 in | Wt 177.8 lb

## 2016-02-05 DIAGNOSIS — M25542 Pain in joints of left hand: Secondary | ICD-10-CM | POA: Diagnosis not present

## 2016-02-05 DIAGNOSIS — Z1239 Encounter for other screening for malignant neoplasm of breast: Secondary | ICD-10-CM

## 2016-02-05 DIAGNOSIS — E2839 Other primary ovarian failure: Secondary | ICD-10-CM

## 2016-02-05 DIAGNOSIS — C50412 Malignant neoplasm of upper-outer quadrant of left female breast: Secondary | ICD-10-CM

## 2016-02-05 DIAGNOSIS — Z87891 Personal history of nicotine dependence: Secondary | ICD-10-CM | POA: Diagnosis not present

## 2016-02-05 DIAGNOSIS — M25541 Pain in joints of right hand: Secondary | ICD-10-CM | POA: Diagnosis not present

## 2016-02-05 DIAGNOSIS — M8580 Other specified disorders of bone density and structure, unspecified site: Secondary | ICD-10-CM

## 2016-02-05 DIAGNOSIS — Z853 Personal history of malignant neoplasm of breast: Secondary | ICD-10-CM | POA: Diagnosis present

## 2016-02-05 DIAGNOSIS — Z79899 Other long term (current) drug therapy: Secondary | ICD-10-CM

## 2016-02-05 LAB — CBC WITH DIFFERENTIAL/PLATELET
BASO%: 1.1 % (ref 0.0–2.0)
BASOS ABS: 0.1 10*3/uL (ref 0.0–0.1)
EOS%: 2.7 % (ref 0.0–7.0)
Eosinophils Absolute: 0.2 10*3/uL (ref 0.0–0.5)
HCT: 39.7 % (ref 34.8–46.6)
HEMOGLOBIN: 12.8 g/dL (ref 11.6–15.9)
LYMPH%: 25.5 % (ref 14.0–49.7)
MCH: 27.9 pg (ref 25.1–34.0)
MCHC: 32.3 g/dL (ref 31.5–36.0)
MCV: 86.3 fL (ref 79.5–101.0)
MONO#: 0.6 10*3/uL (ref 0.1–0.9)
MONO%: 11.1 % (ref 0.0–14.0)
NEUT#: 3.4 10*3/uL (ref 1.5–6.5)
NEUT%: 59.6 % (ref 38.4–76.8)
Platelets: 248 10*3/uL (ref 145–400)
RBC: 4.6 10*6/uL (ref 3.70–5.45)
RDW: 14.9 % — AB (ref 11.2–14.5)
WBC: 5.8 10*3/uL (ref 3.9–10.3)
lymph#: 1.5 10*3/uL (ref 0.9–3.3)

## 2016-02-05 LAB — COMPREHENSIVE METABOLIC PANEL
ALBUMIN: 3.8 g/dL (ref 3.5–5.0)
ALT: 14 U/L (ref 0–55)
AST: 15 U/L (ref 5–34)
Alkaline Phosphatase: 69 U/L (ref 40–150)
Anion Gap: 10 mEq/L (ref 3–11)
BILIRUBIN TOTAL: 0.32 mg/dL (ref 0.20–1.20)
BUN: 21 mg/dL (ref 7.0–26.0)
CHLORIDE: 106 meq/L (ref 98–109)
CO2: 24 meq/L (ref 22–29)
Calcium: 9.3 mg/dL (ref 8.4–10.4)
Creatinine: 0.9 mg/dL (ref 0.6–1.1)
EGFR: 65 mL/min/{1.73_m2} — AB (ref >90)
GLUCOSE: 82 mg/dL (ref 70–140)
POTASSIUM: 3.9 meq/L (ref 3.5–5.1)
SODIUM: 141 meq/L (ref 136–145)
Total Protein: 7.5 g/dL (ref 6.4–8.3)

## 2016-02-05 NOTE — Patient Instructions (Signed)
Call Dr Mariana Kaufman RN after you have been off of anastrozole for ~ 5 days, to let us know if any improvement in hands/ wrists/ legs.  If some better, ok to take short breaks on the anastrozole every 1-2 months if needed for the joint symptoms. If no better after 7-10 days off, would consider seeing orthopedist, since pain would not likely be from anastrozole if no better at all.

## 2016-02-05 NOTE — Progress Notes (Signed)
OFFICE PROGRESS NOTE   February 07, 2016   Physicians: [name] (PCP, Elder Cyphers), Alphonsa Overall, J.Palermo (RT Ledell Noss), Laurin Coder (NP [facility]), [place] Eston Esters, Marcy Panning), _Szulecki (derm)  INTERVAL HISTORY:  Patient is seen, together with husband,  in scheduled 6 month follow up of stage 1 left breast cancer, for which she has been on Arimidex since 11-2012, planned x 5 years. Most recent bilateral screening 3D mammograms were at [facility] 09-26-15, with follow up left mammogram 10-06-15 as technical repeat, with scattered fibroglandular density and no mammographic findings of concern. Last DEXA was 09-2014, this confirmed directly with [facility] now.  Patient mentions significant joint pains which have gradually worsenedm  particularly hands and wrists, presently left moreso than righ. She has had aching "from knees down" x several years, not really clear that this is worse. She is not aware of any changes in breasts on self exam. Energy and appetite are at baseline, no swelling in LE, no respiratory or GI symptoms, no bleeding. She has had no recent infectious illness. Remainder of 10 point Review of Systems negative.   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 MRI showed the single area in mid left breast 1.6 cm. She had lumpectomy and axillary sentinel node evaluation by Dr Lucia Gaskins 08-11-2012, with final pathology 1.7 cm grade 1 with DCIS low to intermediate grade, margins negative and one sentinel node negative. She completed radiation in [facility] by Dr Orlene Erm on 11-17-12 and began anastrozole 11-2012. Oncotype Dx score 18 = risk of recurrence 11%.   Objective:  Vital signs in last 24 hours:  BP 111/70 mmHg  Pulse 76  Temp(Src) 98 F (36.7 C) (Oral)  Resp 18  Ht 5' 2" (1.575  m)  Wt 177 lb 12.8 oz (80.65 kg)  BMI 32.51 kg/m2  SpO2 97% Weight up 4 lbs Alert, oriented and appropriate. Ambulatory without assistance. Does not appear in any acute discomfort. Respirations not labored RA   HEENT:PERRL, sclerae not icteric. Oral mucosa moist without lesions, posterior pharynx clear.  Neck supple. No JVD.  Lymphatics:no cervical,supraclavicular, axillary or inguinal adenopathy Resp: clear to auscultation bilaterally and normal percussion bilaterally Cardio: regular rate and rhythm. No gallop. GI: soft, nontender, not distended, no mass or organomegaly. Normally active bowel sounds.  Musculoskeletal/ Extremities: LE without pitting edema, cords, tenderness. No joint deformities, swelling or heat. Neuro:  Nonfocal. PSYCH appropriate mood and affect Skin without rash, ecchymosis, petechiae Breasts: left lumpectomy scar upper outer thickened, unchanged. Breasts otherwise bilaterally without dominant mass, skin or nipple findings. Axillae benign  Lab Results:  Results for orders placed or performed in visit on 02/05/16  CBC with Differential  Result Value Ref Range   WBC 5.8 3.9 - 10.3 10e3/uL   NEUT# 3.4 1.5 - 6.5 10e3/uL   HGB 12.8 11.6 - 15.9 g/dL   HCT 39.7 34.8 - 46.6 %   Platelets 248 145 - 400 10e3/uL   MCV 86.3 79.5 - 101.0 fL   MCH 27.9 25.1 - 34.0 pg   MCHC 32.3 31.5 - 36.0 g/dL   RBC 4.60 3.70 - 5.45 10e6/uL   RDW 14.9 (H) 11.2 - 14.5 %   lymph# 1.5 0.9 - 3.3 10e3/uL   MONO# 0.6 0.1 - 0.9 10e3/uL   Eosinophils Absolute 0.2 0.0 - 0.5 10e3/uL  Basophils Absolute 0.1 0.0 - 0.1 10e3/uL   NEUT% 59.6 38.4 - 76.8 %   LYMPH% 25.5 14.0 - 49.7 %   MONO% 11.1 0.0 - 14.0 %   EOS% 2.7 0.0 - 7.0 %   BASO% 1.1 0.0 - 2.0 %  Comprehensive metabolic panel  Result Value Ref Range   Sodium 141 136 - 145 mEq/L   Potassium 3.9 3.5 - 5.1 mEq/L   Chloride 106 98 - 109 mEq/L   CO2 24 22 - 29 mEq/L   Glucose 82 70 - 140 mg/dl   BUN 21.0 7.0 - 26.0 mg/dL   Creatinine  0.9 0.6 - 1.1 mg/dL   Total Bilirubin 0.32 0.20 - 1.20 mg/dL   Alkaline Phosphatase 69 40 - 150 U/L   AST 15 5 - 34 U/L   ALT 14 0 - 55 U/L   Total Protein 7.5 6.4 - 8.3 g/dL   Albumin 3.8 3.5 - 5.0 g/dL   Calcium 9.3 8.4 - 10.4 mg/dL   Anion Gap 10 3 - 11 mEq/L   EGFR 65 (L) >90 ml/min/1.73 m2     Studies/Results:  Reports of bilateral 3D screening mammogram 09-26-15 and technical left repeat 10-06-15 requested and received from [facility], will be scanned into this EMR  Medications: I have reviewed the patient's current medications.  DISCUSSION Discussed arthralgias related to aromatase inhibitors, generally gradually progressive but fortunately not permanent after the medication is discontinued. I have suggested she hold the arimidex x 5-7 days to see if she can tell any improvement, more likely by history to be noticeable in hands than in LE, but LE may also improve somewhat. If she can tell some improvement, could resume same medication with occasional short breaks (every 2-3 months) to keep symptoms more tolerable, or could try changing to a different AI such as letrozole (did not discuss letrozole at time of visit). Patient is to speak with Dr Mariana Kaufman RN after she has held arimidex x 5-7 days.   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 She will have bilateral mammograms again in Oct. I will see her back in 6 months or sooner if needed. She will keep yearly follow up with Dr Lucia Gaskins as scheduled. 2. Arthralgias possibly secondary to arimidex: will try short break from the medication to see if any improvement. Plan additionally as above 3. osteopenia: bone density scan 09-2014 [facility] stable from 2013. Will try to continue calcium ~1200 mg daily, continue good weight bearing exercise. Vit D was  70 in 07-2014 Due bone density scan again with mammogram 09-2016. 4.Cigarettes DCd ~ Oct 2015. Encouraged her to continue nonsmoking. 5.GERD no increased symptoms 6.hx colon polyps, up to date on colonoscopy   All questions answered and patient/ husband are in agreement with recommendations and plans as above. Time spent 25 min including >50% counseling and coordination of care. Cc Dr Chauncey Reading, Dr Debby Freiberg, MD   02/07/2016, 10:36 PM

## 2016-02-05 NOTE — Telephone Encounter (Signed)
appt made and avs printed. Pt will schedule own mammogram and bone density appt at [facility]

## 2016-02-07 DIAGNOSIS — M25541 Pain in joints of right hand: Secondary | ICD-10-CM | POA: Insufficient documentation

## 2016-02-07 DIAGNOSIS — Z79899 Other long term (current) drug therapy: Secondary | ICD-10-CM | POA: Insufficient documentation

## 2016-02-07 DIAGNOSIS — M8580 Other specified disorders of bone density and structure, unspecified site: Secondary | ICD-10-CM | POA: Insufficient documentation

## 2016-02-07 DIAGNOSIS — Z87891 Personal history of nicotine dependence: Secondary | ICD-10-CM | POA: Insufficient documentation

## 2016-02-07 DIAGNOSIS — M25542 Pain in joints of left hand: Secondary | ICD-10-CM

## 2016-02-07 DIAGNOSIS — Z1239 Encounter for other screening for malignant neoplasm of breast: Secondary | ICD-10-CM | POA: Insufficient documentation

## 2016-02-11 ENCOUNTER — Telehealth: Payer: Self-pay | Admitting: Oncology

## 2016-02-11 NOTE — Telephone Encounter (Signed)
Medical Oncology  Patient spoke with MD to let me know that aches noticeably better by day 5 off of anastrozole and have resolved entirely (hands, left thumb, LE) by day 6 today "I am not hurting anywhere". Suggested she stay off for full week, then resume anastrozole (as she has new 90 day script, tho she says "no cost to me"). She will let us know in ~ 4 weeks if aches have reoccurred to same very uncomfortable extent, or if they are still better than prior to the short break. If still better/ more tolerable would continue anastrozole at least for the amount that she already has available. If aches again very significant, would try changing anastrozole to letrozole 2.5 mg daily.  Patient is pleased to know that aches will clear just like this when she completes aromatase inhibitor.  Godfrey Pick, MD

## 2016-06-07 ENCOUNTER — Other Ambulatory Visit: Payer: Self-pay | Admitting: Oncology

## 2016-06-07 ENCOUNTER — Telehealth: Payer: Self-pay | Admitting: Oncology

## 2016-06-07 NOTE — Telephone Encounter (Signed)
lvm for pt regarding to 7.13 cx andmoved to 8.7 per MD request

## 2016-06-17 ENCOUNTER — Ambulatory Visit: Payer: Medicare Other | Admitting: Oncology

## 2016-07-11 ENCOUNTER — Other Ambulatory Visit: Payer: Self-pay | Admitting: Oncology

## 2016-07-12 ENCOUNTER — Ambulatory Visit (HOSPITAL_BASED_OUTPATIENT_CLINIC_OR_DEPARTMENT_OTHER): Payer: Medicare Other | Admitting: Oncology

## 2016-07-12 ENCOUNTER — Encounter: Payer: Self-pay | Admitting: Oncology

## 2016-07-12 VITALS — BP 112/78 | HR 78 | Temp 98.4°F | Resp 18 | Ht 62.0 in | Wt 173.6 lb

## 2016-07-12 DIAGNOSIS — Z853 Personal history of malignant neoplasm of breast: Secondary | ICD-10-CM

## 2016-07-12 DIAGNOSIS — C50412 Malignant neoplasm of upper-outer quadrant of left female breast: Secondary | ICD-10-CM

## 2016-07-12 DIAGNOSIS — Z79899 Other long term (current) drug therapy: Secondary | ICD-10-CM

## 2016-07-12 DIAGNOSIS — M25542 Pain in joints of left hand: Secondary | ICD-10-CM

## 2016-07-12 DIAGNOSIS — M2550 Pain in unspecified joint: Secondary | ICD-10-CM | POA: Diagnosis not present

## 2016-07-12 DIAGNOSIS — E2839 Other primary ovarian failure: Secondary | ICD-10-CM | POA: Diagnosis not present

## 2016-07-12 DIAGNOSIS — M25541 Pain in joints of right hand: Secondary | ICD-10-CM

## 2016-07-12 NOTE — Progress Notes (Signed)
OFFICE PROGRESS NOTE   July 12, 2016   Physicians: [name] (PCP, Elder Cyphers), Alphonsa Overall, J.Palermo (RT Ledell Noss), Laurin Coder (NP [facility]), [place] Eston Esters, Marcy Panning), _Szulecki (derm)   INTERVAL HISTORY:  Patient is seen, alone for visit, in follow up of adjuvant arimidex which she continues for stage 1 left breast cancer, planned x 5 years from 11-2012. Most recent mammograms were at [facility] 09-25-16 with follow up left on 10-05-16. Last DEXA was 09-2014 and should be repeated this fall.  After last visit in 02-2016, patient held arimidex for ~ 2 weeks, with almost complete resolution of arthralgia symptoms which were primarily in hands. After resuming the arimidex, the joint symptoms have only gradually and minimally reoccurred, mostly in thumbs now. She is otherwise feeling well, with no noted changes in breasts, good energy and appetite, no SOB, no GI symptoms, no LE swelling, no bleeding, no fever or symptoms of infection. She is now walking 2 miles + weights regularly at gym 6-7 days a week in addition to golfing.  Recent cholesterol 207 by PCP. Remainder of 10 point Review of Systems negative.    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 MRI showed the single area in mid left breast 1.6 cm. She had lumpectomy and axillary sentinel node evaluation by Dr Lucia Gaskins 08-11-2012, with final pathology 1.7 cm grade 1 with DCIS low to intermediate grade, margins negative and one sentinel node negative. She completed radiation in [facility] by Dr Orlene Erm on 11-17-12 and began anastrozole 11-2012. Oncotype Dx score 18 = risk of recurrence 11%   Objective:  Vital signs in last 24 hours:  BP 112/78 (BP Location: Right Arm, Patient Position: Sitting)   Pulse 78   Temp 98.4 F (36.9  C) (Oral)   Resp 18   Ht '5\' 2"'  (1.575 m)   Wt 173 lb 9.6 oz (78.7 kg)   SpO2 100%   BMI 31.75 kg/m  Weight down 4 lbs. Looks comfortable, very pleasant as always.  Alert, oriented and appropriate. Ambulatory without difficulty.    HEENT:PERRL, sclerae not icteric. Oral mucosa moist without lesions, posterior pharynx clear.  Lymphatics:no cervical,supraclavicular adenopathy Resp: clear to auscultation bilaterally and normal percussion bilaterally Cardio: regular rate and rhythm. No gallop. GI: soft, nontender, not distended. Normally active bowel sounds.  Musculoskeletal/ Extremities: no heat, erythema or swelling joints hands bilaterally. LE without pitting edema, cords, tenderness Neuro: nonfocal Skin without rash, ecchymosis, petechiae Patient declined breast exam, tells me this was just done by PCP  Lab Results:  Results for orders placed or performed in visit on 02/05/16  CBC with Differential  Result Value Ref Range   WBC 5.8 3.9 - 10.3 10e3/uL   NEUT# 3.4 1.5 - 6.5 10e3/uL   HGB 12.8 11.6 - 15.9 g/dL   HCT 39.7 34.8 - 46.6 %   Platelets 248 145 - 400 10e3/uL   MCV 86.3 79.5 - 101.0 fL   MCH 27.9 25.1 - 34.0 pg   MCHC 32.3 31.5 - 36.0 g/dL   RBC 4.60 3.70 - 5.45 10e6/uL   RDW 14.9 (H) 11.2 - 14.5 %   lymph# 1.5 0.9 - 3.3 10e3/uL   MONO# 0.6 0.1 - 0.9 10e3/uL   Eosinophils Absolute 0.2 0.0 - 0.5 10e3/uL   Basophils Absolute 0.1 0.0 -  0.1 10e3/uL   NEUT% 59.6 38.4 - 76.8 %   LYMPH% 25.5 14.0 - 49.7 %   MONO% 11.1 0.0 - 14.0 %   EOS% 2.7 0.0 - 7.0 %   BASO% 1.1 0.0 - 2.0 %  Comprehensive metabolic panel  Result Value Ref Range   Sodium 141 136 - 145 mEq/L   Potassium 3.9 3.5 - 5.1 mEq/L   Chloride 106 98 - 109 mEq/L   CO2 24 22 - 29 mEq/L   Glucose 82 70 - 140 mg/dl   BUN 21.0 7.0 - 26.0 mg/dL   Creatinine 0.9 0.6 - 1.1 mg/dL   Total Bilirubin 0.32 0.20 - 1.20 mg/dL   Alkaline Phosphatase 69 40 - 150 U/L   AST 15 5 - 34 U/L   ALT 14 0 - 55 U/L   Total  Protein 7.5 6.4 - 8.3 g/dL   Albumin 3.8 3.5 - 5.0 g/dL   Calcium 9.3 8.4 - 10.4 mg/dL   Anion Gap 10 3 - 11 mEq/L   EGFR 65 (L) >90 ml/min/1.73 m2     Studies/Results:  No results found.  Last DEXA 09-2014, due with mammograms at [place] 09-2016  Medications: I have reviewed the patient's current medications.  DISCUSSION Arthralgias from aromatase inhibitors reviewed. If symptoms increase back to very bothersome level prior to next visit, she will let us know and can take another 1-2 week break if needed, or could try change to a different AI.  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 She will have bilateral mammograms again in Oct.  She will keep scheduled visit here in Nov and yearly follow up with Dr Lucia Gaskins as scheduled. 2. Arthralgias secondary to arimidex: resolved with short break and still not back to as bad as prior to break.  Plan additionally as above 3. osteopenia: bone density scan 09-2014 [facility] stable from 2013. Will try to continue calcium ~1200 mg daily, continue good weight bearing exercise. Vit D was 70 in 07-2014 Due bone density scan again with mammogram 09-2016. 4.Cigarettes DCd ~ Oct 2015.  5.GERD no increased symptoms 6.hx colon polyps, up to date on colonoscopy   All questions answered and patient is in agreement with recommendations and plans, will call prior to next visit if needed. Time spent 15 min including >50% counseling and coordination of care. Route PCP, cc Dr Lucia Gaskins   Evlyn Clines, MD   07/12/2016, 2:13 PM

## 2016-10-29 ENCOUNTER — Encounter: Payer: Self-pay | Admitting: Oncology

## 2016-10-29 NOTE — Progress Notes (Signed)
Medical Oncology  Report received from [facility] dated 10-18-16, bilateral diagnostic tomo mammograms no mammographic findings of concern, scattered fibroglandular densities. Report to be scanned into EMR.  Godfrey Pick, MD

## 2016-10-31 ENCOUNTER — Other Ambulatory Visit: Payer: Self-pay | Admitting: Oncology

## 2016-10-31 DIAGNOSIS — C50411 Malignant neoplasm of upper-outer quadrant of right female breast: Secondary | ICD-10-CM

## 2016-10-31 DIAGNOSIS — Z170 Estrogen receptor positive status [ER+]: Secondary | ICD-10-CM

## 2016-10-31 DIAGNOSIS — E2839 Other primary ovarian failure: Secondary | ICD-10-CM

## 2016-10-31 DIAGNOSIS — M8580 Other specified disorders of bone density and structure, unspecified site: Secondary | ICD-10-CM

## 2016-11-01 ENCOUNTER — Telehealth: Payer: Self-pay | Admitting: Oncology

## 2016-11-01 ENCOUNTER — Other Ambulatory Visit (HOSPITAL_BASED_OUTPATIENT_CLINIC_OR_DEPARTMENT_OTHER): Payer: Medicare Other

## 2016-11-01 ENCOUNTER — Other Ambulatory Visit: Payer: Self-pay

## 2016-11-01 ENCOUNTER — Encounter: Payer: Self-pay | Admitting: Oncology

## 2016-11-01 ENCOUNTER — Ambulatory Visit (HOSPITAL_BASED_OUTPATIENT_CLINIC_OR_DEPARTMENT_OTHER): Payer: Medicare Other | Admitting: Oncology

## 2016-11-01 VITALS — BP 118/70 | HR 67 | Temp 97.8°F | Resp 18 | Ht 62.0 in | Wt 174.5 lb

## 2016-11-01 DIAGNOSIS — E2839 Other primary ovarian failure: Secondary | ICD-10-CM | POA: Diagnosis not present

## 2016-11-01 DIAGNOSIS — Z170 Estrogen receptor positive status [ER+]: Secondary | ICD-10-CM | POA: Diagnosis not present

## 2016-11-01 DIAGNOSIS — Z87891 Personal history of nicotine dependence: Secondary | ICD-10-CM | POA: Diagnosis not present

## 2016-11-01 DIAGNOSIS — M8580 Other specified disorders of bone density and structure, unspecified site: Secondary | ICD-10-CM | POA: Diagnosis not present

## 2016-11-01 DIAGNOSIS — Z79899 Other long term (current) drug therapy: Secondary | ICD-10-CM | POA: Diagnosis not present

## 2016-11-01 DIAGNOSIS — C50411 Malignant neoplasm of upper-outer quadrant of right female breast: Secondary | ICD-10-CM

## 2016-11-01 DIAGNOSIS — Z853 Personal history of malignant neoplasm of breast: Secondary | ICD-10-CM | POA: Diagnosis not present

## 2016-11-01 DIAGNOSIS — C50412 Malignant neoplasm of upper-outer quadrant of left female breast: Secondary | ICD-10-CM

## 2016-11-01 LAB — CBC WITH DIFFERENTIAL/PLATELET
BASO%: 1.3 % (ref 0.0–2.0)
Basophils Absolute: 0.1 10*3/uL (ref 0.0–0.1)
EOS ABS: 0.2 10*3/uL (ref 0.0–0.5)
EOS%: 2.5 % (ref 0.0–7.0)
HCT: 39.7 % (ref 34.8–46.6)
HEMOGLOBIN: 12.7 g/dL (ref 11.6–15.9)
LYMPH%: 28.3 % (ref 14.0–49.7)
MCH: 28 pg (ref 25.1–34.0)
MCHC: 31.9 g/dL (ref 31.5–36.0)
MCV: 87.9 fL (ref 79.5–101.0)
MONO#: 0.6 10*3/uL (ref 0.1–0.9)
MONO%: 10.1 % (ref 0.0–14.0)
NEUT%: 57.8 % (ref 38.4–76.8)
NEUTROS ABS: 3.6 10*3/uL (ref 1.5–6.5)
PLATELETS: 295 10*3/uL (ref 145–400)
RBC: 4.52 10*6/uL (ref 3.70–5.45)
RDW: 14.2 % (ref 11.2–14.5)
WBC: 6.2 10*3/uL (ref 3.9–10.3)
lymph#: 1.7 10*3/uL (ref 0.9–3.3)

## 2016-11-01 LAB — COMPREHENSIVE METABOLIC PANEL
ALBUMIN: 3.6 g/dL (ref 3.5–5.0)
ALT: 13 U/L (ref 0–55)
AST: 14 U/L (ref 5–34)
Alkaline Phosphatase: 67 U/L (ref 40–150)
Anion Gap: 8 mEq/L (ref 3–11)
BILIRUBIN TOTAL: 0.28 mg/dL (ref 0.20–1.20)
BUN: 19.1 mg/dL (ref 7.0–26.0)
CO2: 25 mEq/L (ref 22–29)
CREATININE: 0.8 mg/dL (ref 0.6–1.1)
Calcium: 9.5 mg/dL (ref 8.4–10.4)
Chloride: 105 mEq/L (ref 98–109)
EGFR: 77 mL/min/{1.73_m2} — AB (ref >90)
GLUCOSE: 94 mg/dL (ref 70–140)
Potassium: 4.1 mEq/L (ref 3.5–5.1)
SODIUM: 138 meq/L (ref 136–145)
TOTAL PROTEIN: 7.3 g/dL (ref 6.4–8.3)

## 2016-11-01 MED ORDER — ANASTROZOLE 1 MG PO TABS
1.0000 mg | ORAL_TABLET | Freq: Every day | ORAL | 3 refills | Status: DC
Start: 2016-11-01 — End: 2018-10-25

## 2016-11-01 NOTE — Progress Notes (Signed)
OFFICE PROGRESS NOTE   November 01, 2016   Physicians: [name] (PCP, Elder Cyphers), Alphonsa Overall, J.Palermo (RT Ledell Noss), Laurin Coder (NP [facility]), [place] Eston Esters, Marcy Panning), _Szulecki (derm)  INTERVAL HISTORY:   Patient is seen, alone for visit, in 6 month follow up of adjuvant Arimidex for stage 1 left breast cancer, planned x 5 years from 11-2012. Most recent bilateral mammograms were at [facility] 10-18-16, no mammographic findings of concern. She had bone density scan also at [facility] 10-18-16, this compared with 09-2014 slightly lower tho still osteopenic range. Primary care is by Dr Chauncey Reading in [place], whom she sees ~ every 6 months.  She has previously held Arimidex x 2 weeks spring 2017, with marked improvement in arthralgias then. Arthralgias have not reoccurred to a bothersome level since then (see below).  Patient has felt generally well since she was here last, with exception of pain in right great toe from Sept thru Oct, not gout and no stress fractures per urgent care and follow up with orthopedics. She held Arimidex x 5 days when the pain began, with no improvement. The pain resolved entirely without other intervention, after 2 months. She denies other pain. She has had no changes in either breast. Appetite and energy are good. She golfs, bowls and works out at gym daily now. She has had no recent infectious illness, no respiratory or GI symptoms, no LE swelling, no bleeding.  Remainder of 10 point Review of Systems negative.  Flu vaccine 09-05-16 No genetics testing  She and husband enjoyed a cruise along [place] in Oct and will take a cruise to [place] in Jan.  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 MRI showed the single area in mid left breast 1.6  cm. She had lumpectomy and axillary sentinel node evaluation by Dr Lucia Gaskins 08-11-2012, with final pathology 1.7 cm grade 1 with DCIS low to intermediate grade, margins negative and one sentinel node negative. She completed radiation in [facility] by Dr Orlene Erm on 11-17-12 and began anastrozole 11-2012. Oncotype Dx score 18 = risk of recurrence 11%. Anastrozole was held x 2 weeks spring 2017, with full resolution of arthralgias, then resumed.   Objective:  Vital signs in last 24 hours:  BP 118/70 (BP Location: Left Arm, Patient Position: Sitting)   Pulse 67   Temp 97.8 F (36.6 C) (Oral)   Resp 18   Ht '5\' 2"'  (1.575 m)   Wt 174 lb 8 oz (79.2 kg)   SpO2 98%   BMI 31.92 kg/m  Weight up 1.5 lbs Alert, oriented and appropriate, very pleasant as always. Ambulatory without difficulty. Looks comfortable.   HEENT:PERRL, sclerae not icteric. Oral mucosa moist without lesions, posterior pharynx clear.  Neck supple. No JVD.  Lymphatics:no cervical,supraclavicular, axillary or inguinal adenopathy Resp: clear to auscultation bilaterally and normal percussion bilaterally Cardio: regular rate and rhythm. No gallop. GI: soft, nontender, not distended, no mass or organomegaly. Normally active bowel sounds.  Musculoskeletal/ Extremities: without pitting edema, cords, tenderness Neuro: no peripheral neuropathy. Otherwise nonfocal. PSYCH appropriate mood and affect Skin without rash, ecchymosis, petechiae Breasts: left lumpectomy scar well healed without evidence of local recurrenc, otherwise bilaterally without dominant mass, skin or nipple findings. Axillae benign.   Lab Results:  Results for orders placed or performed in visit on 11/01/16  CBC with Differential  Result Value Ref Range   WBC 6.2 3.9 - 10.3 10e3/uL   NEUT# 3.6 1.5 - 6.5 10e3/uL   HGB 12.7 11.6 - 15.9 g/dL   HCT 39.7 34.8 - 46.6 %   Platelets 295 145 - 400 10e3/uL   MCV 87.9 79.5 - 101.0 fL   MCH 28.0 25.1 - 34.0 pg   MCHC 31.9 31.5 -  36.0 g/dL   RBC 4.52 3.70 - 5.45 10e6/uL   RDW 14.2 11.2 - 14.5 %   lymph# 1.7 0.9 - 3.3 10e3/uL   MONO# 0.6 0.1 - 0.9 10e3/uL   Eosinophils Absolute 0.2 0.0 - 0.5 10e3/uL   Basophils Absolute 0.1 0.0 - 0.1 10e3/uL   NEUT% 57.8 38.4 - 76.8 %   LYMPH% 28.3 14.0 - 49.7 %   MONO% 10.1 0.0 - 14.0 %   EOS% 2.5 0.0 - 7.0 %   BASO% 1.3 0.0 - 2.0 %  Comprehensive metabolic panel  Result Value Ref Range   Sodium 138 136 - 145 mEq/L   Potassium 4.1 3.5 - 5.1 mEq/L   Chloride 105 98 - 109 mEq/L   CO2 25 22 - 29 mEq/L   Glucose 94 70 - 140 mg/dl   BUN 19.1 7.0 - 26.0 mg/dL   Creatinine 0.8 0.6 - 1.1 mg/dL   Total Bilirubin 0.28 0.20 - 1.20 mg/dL   Alkaline Phosphatase 67 40 - 150 U/L   AST 14 5 - 34 U/L   ALT 13 0 - 55 U/L   Total Protein 7.3 6.4 - 8.3 g/dL   Albumin 3.6 3.5 - 5.0 g/dL   Calcium 9.5 8.4 - 10.4 mg/dL   Anion Gap 8 3 - 11 mEq/L   EGFR 77 (L) >90 ml/min/1.73 m2   Last Vit D level in this EMR was 70 in  07-2014  , at which time she DCd supplemental D other than the amount in her calcium tablet.  Studies/Results: [facility] 10-18-16:  Bilateral 3D diagnostic mammograms with stable post treatment changes left breast and no mammographic evidence of malignancy. Breast tissue scattered fibroglandular densities.  Report to be scanned into this EMR  [facility] 10-18-16: Bone density scan. Lowest T score right femoral neck -1.9, with LS and right femoral neck both slightly lower than 2015. Report to be scanned into this EMR.  Medications: I have reviewed the patient's current medications. Continue Arimidex, planned thru 11-2017.  DISCUSSION Imaging reviewed as above. Encouraged good weight bearing exercise, which patient has significantly increased in past 6-8 months, but has not done for the full 2 years since last DEXA. I have given copies of these reports to patient to take to PCP, as he had not received them as of last week, and will send him this note also to update.  She will ask Dr  Chauncey Reading for repeat Vit D level at that office when appropriate.  Patient is aware that she will see one of the breast medical oncologists beginning 2018. Note she has previously seen Drs Truddie Coco and Humphrey Rolls prior to this MD. She prefers a female physician, Dr Burr Medico requested - thank you.   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 She will need bilateral mammograms again in Nov 2018 [facility]).  She will see Dr Burr Medico in 6 months,  and yearly follow up  with Dr Lucia Gaskins as scheduled. 2. Arthralgias secondary to arimidex: resolved with short break and still not back to as bad as prior to break.  3. osteopenia: bone density scan at [facility] 10-2016 slightly lower than 2 years ago. Continue good weight bearing exercise. 4.Cigarettes DCd ~ Oct 2015.  5.GERD no increased symptoms 6.hx colon polyps, up to date on colonoscopy 7.flu vaccine 09-05-16  All questions answered and she knows to call if concerns prior to next scheduled visit. Time spent 25 min including >50% counseling and coordination of care. Cc Drs Chauncey Reading, Vladimir Creeks, and [facility].   Evlyn Clines, MD   11/01/2016, 6:14 PM

## 2016-11-01 NOTE — Telephone Encounter (Signed)
Gave patient avs report and appointments for May. Per 11/27 los patient to follow up with YF.

## 2017-04-29 NOTE — Progress Notes (Addendum)
[place]  Telephone:(336) [phone] Fax:(336) [phone]  Clinic Follow up Note   Patient Care Team: [name], DO as PCP - General (Family Medicine) [name], DO as Consulting Physician (Family Medicine) Avon Gully, NP as Nurse Practitioner (Obstetrics and Gynecology) Thea Silversmith, MD (Inactive) as Consulting Physician (Radiation Oncology) Gatha Mayer, MD as Consulting Physician (Radiation Oncology) Marcy Panning, MD as Consulting Physician (Hematology and Oncology) 05/03/2017  SUMMARY OF ONCOLOGIC HISTORY:   Malignant neoplasm of upper-outer quadrant of left breast in female, estrogen receptor positive ([place])   07/20/2012 Initial Biopsy    Biopsy found  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      07/21/2012 Initial Diagnosis    Malignant neoplasm of upper-outer quadrant of left breast in female, estrogen receptor positive ([place])     08/11/2012 Pathology Results    Final pathology showed  1.7 cm grade 1 with DCIS low to intermediate grade, margins negative and one sentinel node negative       - 11/17/2012 Radiation Therapy    Completed in [place] by Dr. Orlene Erm       Oncotype testing    Dx score 18 = risk of recurrence 11%.      11/2012 -  Anti-estrogen oral therapy    Began Anastrozole.   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 MRI showed the single area in mid left breast 1.6 cm. She had lumpectomy and axillary sentinel node evaluation by Dr Lucia Gaskins 08-11-2012, with final pathology 1.7 cm grade 1 with DCIS low to intermediate grade, margins negative and one sentinel node negative. She  completed radiation in [facility] by Dr Orlene Erm on 11-17-12 and began anastrozole 11-2012. Oncotype Dx score 18 = risk of recurrence 11%. Anastrozole was held x 2 weeks spring 2017, with full resolution of arthralgias, then resumed.   CURRENT THERAPY: Anastrozole   INTERIM HISTORY Patient presents to the clinic today for follow up accompanied by her husband. She was previously under Dr. Mariana Kaufman care, who has recently retired. This is my first encounter with the patient. She has hot flashes at night which are tolerable. She denies any chest or stomach pains. She has had problems with acid reflux. 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   REVIEW OF SYSTEMS:   Constitutional: Denies fevers, chills or abnormal weight loss Eyes: Denies blurriness of vision Ears, nose, mouth, throat, and face: Denies mucositis or sore throat Respiratory: Denies cough, dyspnea or wheezes Cardiovascular: Denies palpitation, chest discomfort or lower extremity swelling Gastrointestinal:  Denies nausea, heartburn or change in bowel habits Skin: Denies abnormal skin rashes Lymphatics: Denies new lymphadenopathy or easy bruising Neurological:Denies numbness, tingling or new weaknesses Behavioral/Psych: Mood is stable, no new changes  All other systems were reviewed with the patient and are negative.  MEDICAL HISTORY:  Past Medical History:  Diagnosis Date  . Arthritis   . Back pain   . Breast cancer ([place])   . Dribbling of urine   . Fatigue   . GERD (gastroesophageal reflux disease)   . Headache(784.0)   . Osteoporosis, unspecified 07/16/2013  . Ringing in ears   .  Sinus complaint   . Skin cancer     SURGICAL HISTORY: Past Surgical History:  Procedure Laterality Date  . BREAST LUMPECTOMY  08/21/12  . COLONOSCOPY    . EYE SURGERY  1986   lt cataract  . TUBAL LIGATION  1982    I have reviewed the social history and family history with the patient and they are unchanged from previous note.  ALLERGIES:   has No Known Allergies.  MEDICATIONS:  Current Outpatient Prescriptions  Medication Sig Dispense Refill  . ALPRAZolam (XANAX) 0.5 MG tablet Take 0.5 mg by mouth at bedtime as needed.     Marland Kitchen anastrozole (ARIMIDEX) 1 MG tablet Take 1 tablet (1 mg total) by mouth daily. 90 tablet 3  . Calcium Carb-Cholecalciferol (CALCIUM 600 + D PO) Take 1 tablet by mouth 2 (two) times daily.    . fluticasone (FLONASE) 50 MCG/ACT nasal spray     . ibuprofen (ADVIL,MOTRIN) 200 MG tablet Take 200 mg by mouth every 6 (six) hours as needed.    . Probiotic Product (PROBIOTIC-10 PO) Take by mouth.    . Pseudoephedrine HCl (SUDAFED CONGESTION PO) Take 2 tablets by mouth 2 (two) times daily as needed.    . zolpidem (AMBIEN) 10 MG tablet Take 5 mg by mouth at bedtime as needed.      No current facility-administered medications for this visit.     PHYSICAL EXAMINATION:  ECOG PERFORMANCE STATUS: 0 - Asymptomatic  Vitals:   05/03/17 1306  BP: 110/73  Pulse: 60  Resp: 20   Filed Weights   05/03/17 1306  Weight: 178 lb (80.7 kg)    GENERAL:alert, no distress and comfortable SKIN: skin color, texture, turgor are normal, no rashes or significant lesions EYES: normal, Conjunctiva are pink and non-injected, sclera clear OROPHARYNX:no exudate, no erythema and lips, buccal mucosa, and tongue normal  NECK: supple, thyroid normal size, non-tender, without nodularity LYMPH:  no palpable lymphadenopathy in the cervical, axillary or inguinal LUNGS: clear to auscultation and percussion with normal breathing effort HEART: regular rate & rhythm and no murmurs and no lower extremity edema ABDOMEN:abdomen soft, non-tender and normal bowel sounds Musculoskeletal:no cyanosis of digits and no clubbing  NEURO: alert & oriented x 3 with fluent speech, no focal motor/sensory deficits Breasts: Breast inspection showed them to be symmetrical with no nipple discharge. Palpation of the breasts and axilla revealed no obvious mass that  I could appreciate. (+) surgical scar on left breast   LABORATORY DATA:  I have reviewed the data as listed CBC Latest Ref Rng & Units 05/03/2017 11/01/2016 02/05/2016  WBC 3.9 - 10.3 10e3/uL 5.2 6.2 5.8  Hemoglobin 11.6 - 15.9 g/dL 13.5 12.7 12.8  Hematocrit 34.8 - 46.6 % 41.1 39.7 39.7  Platelets 145 - 400 10e3/uL 273 295 248     CMP Latest Ref Rng & Units 05/03/2017 11/01/2016 02/05/2016  Glucose 70 - 140 mg/dl 90 94 82  BUN 7.0 - 26.0 mg/dL 21.4 19.1 21.0  Creatinine 0.6 - 1.1 mg/dL 0.8 0.8 0.9  Sodium 136 - 145 mEq/L 140 138 141  Potassium 3.5 - 5.1 mEq/L 4.0 4.1 3.9  Chloride 96 - 112 mEq/L - - -  CO2 22 - 29 mEq/L '27 25 24  ' Calcium 8.4 - 10.4 mg/dL 9.7 9.5 9.3  Total Protein 6.4 - 8.3 g/dL 7.2 7.3 7.5  Total Bilirubin 0.20 - 1.20 mg/dL 0.31 0.28 0.32  Alkaline Phos 40 - 150 U/L 61 67 69  AST 5 - 34 U/L  '15 14 15  ' ALT 0 - 55 U/L '14 13 14      ' RADIOGRAPHIC STUDIES: I have personally reviewed the radiological images as listed and agreed with the findings in the report. No results found.   ASSESSMENT & PLAN: 71 y.o.  O patient female  1. 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 She was diagnosed 07-2012, treated with lumpectomy and one sentinel node 08-2012, local radiation and on anastrozole since 11-2012, planned for 5 years. -she is clinically doing very well, asymptomatic, lab CBC and CMP normal today, exam was unremarkable, no clinical concern for recurrence. -continue breast cancer surveillance. We discussed the small risk of later recurrence in hormonal sensitive breast cancer.  - She will need bilateral mammograms again in Nov 2018 [facility]). -  I will order mammogram for December - She has been tolerating Anastrozole well. We will continue and finish in December 2018 -I encouraged her to  continue healthy diet and exercise, she is physically active.  2. Osteopenia - she takes calcium and vitamin d -I'll repeat her vitamin D level on next Visit, previously normal in 2015.  3. Acid Reflux  4. Back pain -mild and overall tolerable. - Patient is doing water aerobics to help.   PLAN -get copy of her last mammogram andDEXA scan from [facility] -order mammogram for Deccemeber - f/u and lab including Vit D level in 6 months    No problem-specific Assessment & Plan notes found for this encounter.   Orders Placed This Encounter  Procedures  . MM DIAG BREAST TOMO BILATERAL    Standing Status:   Future    Standing Expiration Date:   07/03/2018    Scheduling Instructions:     [facility]    Order Specific Question:   Reason for Exam (SYMPTOM  OR DIAGNOSIS REQUIRED)    Answer:   screening    Order Specific Question:   Preferred imaging location?    Answer:   External  . CBC with Differential    Standing Status:   Standing    Number of Occurrences:   30    Standing Expiration Date:   05/02/2022  . Comprehensive metabolic panel    Standing Status:   Standing    Number of Occurrences:   30    Standing Expiration Date:   05/02/2022  . Vitamin D 25 hydroxy    Standing Status:   Standing    Number of Occurrences:   10    Standing Expiration Date:   05/03/2022     All questions were answered. The patient knows to call the clinic with any problems, questions or concerns. No barriers to learning was detected. I spent 30 minutes counseling the patient face to face. The total time spent in the appointment was 35 minutes and more than 50% was on counseling and review of test results   This document serves as a record of services personally performed by Truitt Merle, MD. It was created on her behalf by Brandt Loosen, a trained medical scribe. The creation of this record is based on the scribe's personal observations and the provider's statements to them. This document has been checked and  approved by the attending provider.   Truitt Merle, MD 05/03/2017   Addendum Received the reports from [place], her mammogram from 10/18/2016 was negative, follow-up in 12 months  was recommended. Last bone density scan was done on 10/18/2016, with T score -1.9 in the right femur neck, osteopenia.  Truitt Merle  05/03/2017

## 2017-05-03 ENCOUNTER — Ambulatory Visit (HOSPITAL_BASED_OUTPATIENT_CLINIC_OR_DEPARTMENT_OTHER): Payer: Medicare Other | Admitting: Hematology

## 2017-05-03 ENCOUNTER — Other Ambulatory Visit (HOSPITAL_BASED_OUTPATIENT_CLINIC_OR_DEPARTMENT_OTHER): Payer: Medicare Other

## 2017-05-03 ENCOUNTER — Telehealth: Payer: Self-pay | Admitting: Hematology

## 2017-05-03 ENCOUNTER — Encounter: Payer: Self-pay | Admitting: Hematology

## 2017-05-03 VITALS — BP 110/73 | HR 60 | Resp 20 | Ht 62.0 in | Wt 178.0 lb

## 2017-05-03 DIAGNOSIS — M810 Age-related osteoporosis without current pathological fracture: Secondary | ICD-10-CM | POA: Diagnosis not present

## 2017-05-03 DIAGNOSIS — Z170 Estrogen receptor positive status [ER+]: Secondary | ICD-10-CM

## 2017-05-03 DIAGNOSIS — Z853 Personal history of malignant neoplasm of breast: Secondary | ICD-10-CM

## 2017-05-03 DIAGNOSIS — C50412 Malignant neoplasm of upper-outer quadrant of left female breast: Secondary | ICD-10-CM

## 2017-05-03 LAB — CBC WITH DIFFERENTIAL/PLATELET
BASO%: 1.4 % (ref 0.0–2.0)
BASOS ABS: 0.1 10*3/uL (ref 0.0–0.1)
EOS ABS: 0.1 10*3/uL (ref 0.0–0.5)
EOS%: 2.1 % (ref 0.0–7.0)
HEMATOCRIT: 41.1 % (ref 34.8–46.6)
HEMOGLOBIN: 13.5 g/dL (ref 11.6–15.9)
LYMPH#: 1.5 10*3/uL (ref 0.9–3.3)
LYMPH%: 28.5 % (ref 14.0–49.7)
MCH: 29 pg (ref 25.1–34.0)
MCHC: 32.8 g/dL (ref 31.5–36.0)
MCV: 88.4 fL (ref 79.5–101.0)
MONO#: 0.6 10*3/uL (ref 0.1–0.9)
MONO%: 12.3 % (ref 0.0–14.0)
NEUT#: 2.9 10*3/uL (ref 1.5–6.5)
NEUT%: 55.7 % (ref 38.4–76.8)
Platelets: 273 10*3/uL (ref 145–400)
RBC: 4.65 10*6/uL (ref 3.70–5.45)
RDW: 15.6 % — AB (ref 11.2–14.5)
WBC: 5.2 10*3/uL (ref 3.9–10.3)

## 2017-05-03 LAB — COMPREHENSIVE METABOLIC PANEL
ALBUMIN: 3.9 g/dL (ref 3.5–5.0)
ALK PHOS: 61 U/L (ref 40–150)
ALT: 14 U/L (ref 0–55)
AST: 15 U/L (ref 5–34)
Anion Gap: 7 mEq/L (ref 3–11)
BILIRUBIN TOTAL: 0.31 mg/dL (ref 0.20–1.20)
BUN: 21.4 mg/dL (ref 7.0–26.0)
CALCIUM: 9.7 mg/dL (ref 8.4–10.4)
CO2: 27 mEq/L (ref 22–29)
Chloride: 107 mEq/L (ref 98–109)
Creatinine: 0.8 mg/dL (ref 0.6–1.1)
EGFR: 70 mL/min/{1.73_m2} — AB (ref >90)
Glucose: 90 mg/dl (ref 70–140)
POTASSIUM: 4 meq/L (ref 3.5–5.1)
Sodium: 140 mEq/L (ref 136–145)
TOTAL PROTEIN: 7.2 g/dL (ref 6.4–8.3)

## 2017-05-03 NOTE — Telephone Encounter (Signed)
Appointments scheduled per 05/03/17 los. Patient was given a copy of the AVS report and appointment schedule, per 05/03/17 los.

## 2017-06-02 ENCOUNTER — Other Ambulatory Visit: Payer: Self-pay | Admitting: Nurse Practitioner

## 2017-08-06 ENCOUNTER — Other Ambulatory Visit: Payer: Self-pay | Admitting: Nurse Practitioner

## 2017-11-03 NOTE — Progress Notes (Signed)
[facility]  Telephone:(336) [phone] Fax:(336) [phone]  Clinic Follow up Note   Patient Care Team: [name], DO as PCP - General (Family Medicine) [name], DO as Consulting Physician (Family Medicine) Avon Gully, NP as Nurse Practitioner (Obstetrics and Gynecology) Thea Silversmith, MD (Inactive) as Consulting Physician (Radiation Oncology) Gatha Mayer, MD as Consulting Physician (Radiation Oncology) Marcy Panning, MD as Consulting Physician (Hematology and Oncology) 11/04/2017  SUMMARY OF ONCOLOGIC HISTORY:   Malignant neoplasm of upper-outer quadrant of left breast in female, estrogen receptor positive ([place])   07/20/2012 Initial Biopsy    Biopsy found  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      07/21/2012 Initial Diagnosis    Malignant neoplasm of upper-outer quadrant of left breast in female, estrogen receptor positive ([place])      08/11/2012 Pathology Results    Final pathology showed  1.7 cm grade 1 with DCIS low to intermediate grade, margins negative and one sentinel node negative       - 11/17/2012 Radiation Therapy    Completed in [place] by Dr. Orlene Erm       Oncotype testing    Dx score 18 = risk of recurrence 11%.      11/2012 -  Anti-estrogen oral therapy    Began Anastrozole.   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 MRI showed the single area in mid left breast 1.6 cm. She had lumpectomy and axillary sentinel node evaluation by Dr Lucia Gaskins 08-11-2012, with final pathology 1.7 cm grade 1 with DCIS low to intermediate grade, margins negative and one  sentinel node negative. She completed radiation in [facility] by Dr Orlene Erm on 11-17-12 and began anastrozole 11-2012. Oncotype Dx score 18 = risk of recurrence 11%. Anastrozole was held x 2 weeks spring 2017, with full resolution of arthralgias, then resumed.   CURRENT THERAPY: Anastrozole 74m daily started in 11/2012, plan to complete in 11/2017   INTERIM HISTORY  Patient presents to the clinic today for follow up. She was last seen by me 6 months ago. She presents to the clinic today noting she has been going to PT since 08/2017 for her rotator cuff. She is still taking anastrozole, she does experience joint pain but is fine to continue and complete in 11/2017. She had a mammogram in 9-09/2017. They mentioned she now has dense breast, which is different from before. She will repeat in a year.  She notes the side of her face was in pain previously and went to Urgent care who said she had a migraine. She wanted a second option if this occurs again and wondered if there was a recommendation for an EAllisonia She notes her cholesterol with her PCP was 188, which improved. She has been walking more and being more active. She plans to get her Pneumonia shot soon. Overall she is doing well.     REVIEW OF SYSTEMS:   Constitutional: Denies fevers, chills or abnormal weight loss Eyes: Denies blurriness of vision Ears, nose, mouth, throat, and face: Denies mucositis or sore throat Respiratory: Denies cough, dyspnea or wheezes Cardiovascular:  Denies palpitation, chest discomfort or lower extremity swelling Gastrointestinal:  Denies nausea, heartburn or change in bowel habits Skin: Denies abnormal skin rashes Lymphatics: Denies new lymphadenopathy or easy bruising MSK: (+) Joint pain  Neurological:Denies numbness, tingling or new weaknesses Behavioral/Psych: Mood is stable, no new changes  All other systems were reviewed with the patient and are negative.  MEDICAL HISTORY:  Past Medical History:    Diagnosis Date  . Arthritis   . Back pain   . Breast cancer ([place])   . Dribbling of urine   . Fatigue   . GERD (gastroesophageal reflux disease)   . Headache(784.0)   . Osteoporosis, unspecified 07/16/2013  . Ringing in ears   . Sinus complaint   . Skin cancer     SURGICAL HISTORY: Past Surgical History:  Procedure Laterality Date  . BREAST LUMPECTOMY  08/21/12  . COLONOSCOPY    . EYE SURGERY  1986   lt cataract  . TUBAL LIGATION  1982    I have reviewed the social history and family history with the patient and they are unchanged from previous note.  ALLERGIES:  has No Known Allergies.  MEDICATIONS:  Current Outpatient Medications  Medication Sig Dispense Refill  . ALPRAZolam (XANAX) 0.5 MG tablet Take 0.5 mg by mouth at bedtime as needed.     Marland Kitchen anastrozole (ARIMIDEX) 1 MG tablet Take 1 tablet (1 mg total) by mouth daily. 90 tablet 3  . Calcium Carb-Cholecalciferol (CALCIUM 600 + D PO) Take 1 tablet by mouth 2 (two) times daily.    . fluticasone (FLONASE) 50 MCG/ACT nasal spray     . ibuprofen (ADVIL,MOTRIN) 200 MG tablet Take 200 mg by mouth every 6 (six) hours as needed.    . Probiotic Product (PROBIOTIC-10 PO) Take by mouth.    . Pseudoephedrine HCl (SUDAFED CONGESTION PO) Take 2 tablets by mouth 2 (two) times daily as needed.    . zolpidem (AMBIEN) 10 MG tablet Take 5 mg by mouth at bedtime as needed.      No current facility-administered medications for this visit.     PHYSICAL EXAMINATION:  ECOG PERFORMANCE STATUS: 0 - Asymptomatic  Vitals:   11/04/17 1252  BP: 112/73  Pulse: 66  Resp: 18  Temp: 98.3 F (36.8 C)  SpO2: 99%   Filed Weights   11/04/17 1252  Weight: 176 lb 8 oz (80.1 kg)    GENERAL:alert, no distress and comfortable SKIN: skin color, texture, turgor are normal, no rashes or significant lesions EYES: normal, Conjunctiva are pink and non-injected, sclera clear OROPHARYNX:no exudate, no erythema and lips, buccal mucosa, and tongue  normal  NECK: supple, thyroid normal size, non-tender, without nodularity LYMPH:  no palpable lymphadenopathy in the cervical, axillary or inguinal LUNGS: clear to auscultation and percussion with normal breathing effort HEART: regular rate & rhythm and no murmurs and no lower extremity edema ABDOMEN:abdomen soft, non-tender and normal bowel sounds Musculoskeletal:no cyanosis of digits and no clubbing  NEURO: alert & oriented x 3 with fluent speech, no focal motor/sensory deficits Breasts: Breast inspection showed them to be symmetrical with no nipple discharge. Palpation of the breasts and axilla revealed no obvious mass that I could appreciate. (+) surgical scar on left breast, healed well    LABORATORY DATA:  I have reviewed the data as listed CBC Latest Ref Rng & Units 11/04/2017 05/03/2017 11/01/2016  WBC 3.9 - 10.3 10e3/uL 5.1 5.2 6.2  Hemoglobin 11.6 - 15.9 g/dL 12.9 13.5 12.7  Hematocrit 34.8 - 46.6 %  40.7 41.1 39.7  Platelets 145 - 400 10e3/uL 268 273 295     CMP Latest Ref Rng & Units 11/04/2017 05/03/2017 11/01/2016  Glucose 70 - 140 mg/dl 99 90 94  BUN 7.0 - 26.0 mg/dL 19.5 21.4 19.1  Creatinine 0.6 - 1.1 mg/dL 0.9 0.8 0.8  Sodium 136 - 145 mEq/L 139 140 138  Potassium 3.5 - 5.1 mEq/L 3.5 4.0 4.1  Chloride 96 - 112 mEq/L - - -  CO2 22 - 29 mEq/L _0 Calcium 8.4 - 10.4 mg/dL 9.1 9.7 9.5  Total Protein 6.4 - 8.3 g/dL 7.6 7.2 7.3  Total Bilirubin 0.20 - 1.20 mg/dL 0.32 0.31 0.28  Alkaline Phos 40 - 150 U/L 62 61 67  AST 5 - 34 U/L _1 ALT 0 - 55 U/L _2 RADIOGRAPHIC STUDIES: I have personally reviewed the radiological images as listed and agreed with the findings in the report. No results found.    [facility] Mammogram from 10/18/2016 was negative, follow-up in 12 months was recommended.    Bone density scan was done on 10/18/2016, with T score -1.9 in the right femur neck, osteopenia.  ASSESSMENT & PLAN: 71 y.o.  O patient female  1.  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 She was diagnosed 07-2012, treated with lumpectomy and one sentinel node 08-2012, local radiation and on anastrozole since 11-2012, planned for 5 years. -continue breast cancer surveillance. We discussed the small risk of later recurrence in hormonal sensitive breast cancer.  -I encouraged her to continue healthy diet and exercise, she is physically active. - She has been tolerating Anastrozole well. We will continue and finish in December 2018 -she is clinically doing very well, asymptomatic. Labs reviewed, CBC and CMP normal today, exam and 08/2017 mammogram were unremarkable. There is no clinical concern for recurrence. - She is over 5 years out from diagnosis. She will proceed with Screening mammograms. Next in 09/2018.  -F/u in 12 months   2. Osteopenia - she takes calcium and vitamin d -Bone density scan was done on 10/18/2016, with T score -1.9 in the right femur neck, osteopenia. -Vitamin D level pending today, previously normal in 2015.  3. Acid Reflux  4. Back pain -mild and overall tolerable. - Patient is doing water aerobics to help.   PLAN -Lab and f/u in 12 months  -Mammogram in 09/2018  -get copy of her last mammogram from [facility] Anastrozole, complete in 11/2017  No problem-specific Assessment & Plan notes found for this encounter.   No orders of the defined types were placed in this encounter.    All questions were answered. The patient knows to call the clinic with any problems, questions or concerns. No barriers to learning was detected.  I spent 15 minutes counseling the patient face to face. The total time spent in the appointment was 20 minutes and more than 50% was on counseling and review of test results  This document serves as a  record of services personally performed by Truitt Merle, MD. It was created on her behalf by Joslyn Devon, a trained medical scribe. The creation of this record is based on the scribe's personal observations and the provider's statements to them.    I have reviewed the above documentation for accuracy and  completeness, and I agree with the above.   Truitt Merle, MD 11/04/2017

## 2017-11-04 ENCOUNTER — Ambulatory Visit (HOSPITAL_BASED_OUTPATIENT_CLINIC_OR_DEPARTMENT_OTHER): Payer: Medicare Other | Admitting: Hematology

## 2017-11-04 ENCOUNTER — Other Ambulatory Visit (HOSPITAL_BASED_OUTPATIENT_CLINIC_OR_DEPARTMENT_OTHER): Payer: Medicare Other

## 2017-11-04 ENCOUNTER — Telehealth: Payer: Self-pay | Admitting: Hematology

## 2017-11-04 VITALS — BP 112/73 | HR 66 | Temp 98.3°F | Resp 18 | Wt 176.5 lb

## 2017-11-04 DIAGNOSIS — Z853 Personal history of malignant neoplasm of breast: Secondary | ICD-10-CM | POA: Diagnosis present

## 2017-11-04 DIAGNOSIS — C50412 Malignant neoplasm of upper-outer quadrant of left female breast: Secondary | ICD-10-CM

## 2017-11-04 DIAGNOSIS — Z170 Estrogen receptor positive status [ER+]: Principal | ICD-10-CM

## 2017-11-04 DIAGNOSIS — M810 Age-related osteoporosis without current pathological fracture: Secondary | ICD-10-CM

## 2017-11-04 LAB — CBC WITH DIFFERENTIAL/PLATELET
BASO%: 1 % (ref 0.0–2.0)
Basophils Absolute: 0.1 10*3/uL (ref 0.0–0.1)
EOS ABS: 0.1 10*3/uL (ref 0.0–0.5)
EOS%: 2.1 % (ref 0.0–7.0)
HCT: 40.7 % (ref 34.8–46.6)
HGB: 12.9 g/dL (ref 11.6–15.9)
LYMPH%: 26.8 % (ref 14.0–49.7)
MCH: 28.8 pg (ref 25.1–34.0)
MCHC: 31.7 g/dL (ref 31.5–36.0)
MCV: 90.8 fL (ref 79.5–101.0)
MONO#: 0.5 10*3/uL (ref 0.1–0.9)
MONO%: 10.3 % (ref 0.0–14.0)
NEUT%: 59.8 % (ref 38.4–76.8)
NEUTROS ABS: 3.1 10*3/uL (ref 1.5–6.5)
PLATELETS: 268 10*3/uL (ref 145–400)
RBC: 4.48 10*6/uL (ref 3.70–5.45)
RDW: 14.6 % — ABNORMAL HIGH (ref 11.2–14.5)
WBC: 5.1 10*3/uL (ref 3.9–10.3)
lymph#: 1.4 10*3/uL (ref 0.9–3.3)

## 2017-11-04 LAB — COMPREHENSIVE METABOLIC PANEL
ALT: 14 U/L (ref 0–55)
AST: 18 U/L (ref 5–34)
Albumin: 3.9 g/dL (ref 3.5–5.0)
Alkaline Phosphatase: 62 U/L (ref 40–150)
Anion Gap: 9 mEq/L (ref 3–11)
BILIRUBIN TOTAL: 0.32 mg/dL (ref 0.20–1.20)
BUN: 19.5 mg/dL (ref 7.0–26.0)
CALCIUM: 9.1 mg/dL (ref 8.4–10.4)
CO2: 24 meq/L (ref 22–29)
Chloride: 106 mEq/L (ref 98–109)
Creatinine: 0.9 mg/dL (ref 0.6–1.1)
EGFR: >60 mL/min/{1.73_m2} (ref >60)
Glucose: 99 mg/dl (ref 70–140)
Potassium: 3.5 mEq/L (ref 3.5–5.1)
Sodium: 139 mEq/L (ref 136–145)
TOTAL PROTEIN: 7.6 g/dL (ref 6.4–8.3)

## 2017-11-04 NOTE — Telephone Encounter (Signed)
Gave avs and calendar for November 2019 °

## 2017-11-05 LAB — VITAMIN D 25 HYDROXY (VIT D DEFICIENCY, FRACTURES): Vitamin D, 25-Hydroxy: 57.3 ng/mL (ref 30.0–100.0)

## 2017-11-06 ENCOUNTER — Encounter: Payer: Self-pay | Admitting: Hematology

## 2018-10-17 ENCOUNTER — Telehealth: Payer: Self-pay

## 2018-10-17 NOTE — Telephone Encounter (Signed)
Faxed signed order to [facility] for Bone Density scan,  Sent for scanning into chart.

## 2018-10-24 NOTE — Progress Notes (Signed)
[place]  Telephone:(336) [phone] Fax:(336) [phone]  Clinic Follow up Note   Patient Care Team: [name], DO as PCP - General (Family Medicine) [name], DO as Consulting Physician (Family Medicine) Avon Gully, NP as Nurse Practitioner (Obstetrics and Gynecology) Thea Silversmith, MD as Consulting Physician (Radiation Oncology) Gatha Mayer, MD as Consulting Physician (Radiation Oncology) Marcy Panning, MD as Consulting Physician (Hematology and Oncology) 10/25/2018  CHIEF COMPLAIN: f/u left breast cancer  SUMMARY OF ONCOLOGIC HISTORY:   Malignant neoplasm of upper-outer quadrant of left breast in female, estrogen receptor positive ([place])   07/20/2012 Initial Biopsy    Biopsy found  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    07/21/2012 Initial Diagnosis    Malignant neoplasm of upper-outer quadrant of left breast in female, estrogen receptor positive ([place])    08/11/2012 Pathology Results    Final pathology showed  1.7 cm grade 1 with DCIS low to intermediate grade, margins negative and one sentinel node negative     - 11/17/2012 Radiation Therapy    Completed in [place] by Dr. Orlene Erm     Oncotype testing    Dx score 18 = risk of recurrence 11%.    11/2012 -  Anti-estrogen oral therapy    Began Anastrozole.   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 MRI showed the single area in mid left breast 1.6 cm. She had lumpectomy and axillary sentinel node evaluation by Dr Lucia Gaskins 08-11-2012, with final pathology 1.7 cm grade 1 with DCIS low to intermediate grade, margins  negative and one sentinel node negative. She completed radiation in [facility] by Dr Orlene Erm on 11-17-12 and began anastrozole 11-2012. Oncotype Dx score 18 = risk of recurrence 11%. Anastrozole was held x 2 weeks spring 2017, with full resolution of arthralgias, then resumed.   CURRENT THERAPY: Anastrozole 35m daily started in 11/2012, plan to complete in 11/2017   INTERIM HISTORY  Patient presents to the clinic today for follow up. She was last seen by me almost a year ago. Today, she is here with her husband. She is doing well and denies new complaints. She still gets hot flashes, but are overall tolerable. She [facility]     REVIEW OF SYSTEMS:   Constitutional: Denies fevers, chills or abnormal weight loss (+) hot flahses Eyes: Denies blurriness of vision Ears, nose, mouth, throat, and face: Denies mucositis or sore throat Respiratory: Denies cough, dyspnea or wheezes Cardiovascular: Denies palpitation, chest discomfort or lower extremity swelling Gastrointestinal:  Denies nausea, heartburn or change in bowel habits Skin: Denies abnormal skin rashes Lymphatics: Denies new lymphadenopathy or easy bruising MSK: (+) Joint pain  Neurological:Denies numbness, tingling or new weaknesses Behavioral/Psych: Mood is stable, no new changes  All other systems were reviewed with the patient and are negative.  MEDICAL HISTORY:  Past Medical History:  Diagnosis Date  . Arthritis   . Back pain   . Breast cancer ([place]   . Dribbling of urine   . Fatigue   . GERD (gastroesophageal reflux disease)   .  Headache(784.0)   . Osteoporosis, unspecified 07/16/2013  . Ringing in ears   . Sinus complaint   . Skin cancer     SURGICAL HISTORY: Past Surgical History:  Procedure Laterality Date  . BREAST LUMPECTOMY  08/21/12  . COLONOSCOPY    . EYE SURGERY  1986   lt cataract  . TUBAL LIGATION  1982    I have reviewed the social history and family history  with the patient and they are unchanged from previous note.  ALLERGIES:  has No Known Allergies.  MEDICATIONS:  Current Outpatient Medications  Medication Sig Dispense Refill  . ALPRAZolam (XANAX) 0.5 MG tablet Take 0.5 mg by mouth at bedtime as needed.     . Calcium Carb-Cholecalciferol (CALCIUM 600 + D PO) Take 1 tablet by mouth 2 (two) times daily.    . fluticasone (FLONASE) 50 MCG/ACT nasal spray     . ibuprofen (ADVIL,MOTRIN) 200 MG tablet Take 200 mg by mouth every 6 (six) hours as needed.    . Probiotic Product (PROBIOTIC-10 PO) Take by mouth.    . Pseudoephedrine HCl (SUDAFED CONGESTION PO) Take 2 tablets by mouth 2 (two) times daily as needed.    . zolpidem (AMBIEN) 10 MG tablet Take 5 mg by mouth at bedtime as needed.      No current facility-administered medications for this visit.     PHYSICAL EXAMINATION:  ECOG PERFORMANCE STATUS: 0 - Asymptomatic  Vitals:   10/25/18 1308  BP: 112/77  Pulse: 65  Resp: 19  Temp: 98.1 F (36.7 C)  SpO2: 98%   Filed Weights   10/25/18 1308  Weight: 164 lb 11.2 oz (74.7 kg)    GENERAL:alert, no distress and comfortable SKIN: skin color, texture, turgor are normal, no rashes or significant lesions EYES: normal, Conjunctiva are pink and non-injected, sclera clear OROPHARYNX:no exudate, no erythema and lips, buccal mucosa, and tongue normal  NECK: supple, thyroid normal size, non-tender, without nodularity LYMPH:  no palpable lymphadenopathy in the cervical, axillary or inguinal LUNGS: clear to auscultation and percussion with normal breathing effort HEART: regular rate & rhythm and no murmurs and no lower extremity edema ABDOMEN:abdomen soft, non-tender and normal bowel sounds Musculoskeletal:no cyanosis of digits and no clubbing  NEURO: alert & oriented x 3 with fluent speech, no focal motor/sensory deficits Breasts: Breast inspection showed them to be symmetrical with no nipple discharge. Palpation of the breasts and axilla  revealed no obvious mass that I could appreciate. (+) surgical scar on left breast, stable    LABORATORY DATA:  I have reviewed the data as listed CBC Latest Ref Rng & Units 10/25/2018 11/04/2017 05/03/2017  WBC 4.0 - 10.5 K/uL 5.4 5.1 5.2  Hemoglobin 12.0 - 15.0 g/dL 12.5 12.9 13.5  Hematocrit 36.0 - 46.0 % 39.5 40.7 41.1  Platelets 150 - 400 K/uL 236 268 273     CMP Latest Ref Rng & Units 10/25/2018 11/04/2017 05/03/2017  Glucose 70 - 99 mg/dL 91 99 90  BUN 8 - 23 mg/dL 19 19.5 21.4  Creatinine 0.44 - 1.00 mg/dL 0.80 0.9 0.8  Sodium 135 - 145 mmol/L 139 139 140  Potassium 3.5 - 5.1 mmol/L 3.9 3.5 4.0  Chloride 98 - 111 mmol/L 107 - -  CO2 22 - 32 mmol/L '25 24 27  ' Calcium 8.9 - 10.3 mg/dL 8.8(L) 9.1 9.7  Total Protein 6.5 - 8.1 g/dL 7.1 7.6 7.2  Total Bilirubin 0.3 - 1.2 mg/dL 0.4 0.32 0.31  Alkaline Phos 38 - 126 U/L  59 62 61  AST 15 - 41 U/L 13(L) 18 15  ALT 0 - 44 U/L '10 14 14      ' RADIOGRAPHIC STUDIES: I have personally reviewed the radiological images as listed and agreed with the findings in the report. No results found.    [facility]ammogram from 10/18/2016 was negative, follow-up in 12 months was recommended.  Bone density scan was done on 10/18/2016, with T score -1.9 in the right femur neck, osteopenia.  ASSESSMENT & PLAN:  72 y.o.  O patient female  1. Malignant neoplasm of upper-outer quadrant of left breast, invasive ductal carcinomap, G1, T1N0 stage IA, ER+/ PR + HER 2 - -I reviewed her medical records extensively, and confirmed keep findings with patient. She was diagnosed 07-2012, treated with lumpectomy and one sentinel node 08-2012, local radiation and on anastrozole since 11-2012, planned for 5 years. -continue breast cancer surveillance. We discussed the small risk of later recurrence in hormonal sensitive breast cancer.  -I encouraged her to continue healthy diet and exercise, she is physically active. -Anastrozole completed in December 2018 -she is  clinically doing very well, asymptomatic. Labs reviewed, CBC is WNLs CMP and vitamin D pending, exam and 08/2017 mammogram were unremarkable. There is no clinical concern for recurrence. - She is over 6 years out from diagnosis. She will proceed with Screening mammograms.  -She will have a mammogram and DEXA scan in 2 weeks at [facility].  -She will f/u with her PCP  -F/u in 12 months   2. Osteopenia - she takes calcium and vitamin D -Bone density scan was done on 10/18/2016, with T score -1.9 in the right femur neck, osteopenia. -She takes calcium and Vitamin D. Vitamin D levels pending today -She will have a DEXA scan in 2 weeks at [facility].    3. Back pain -mild and overall tolerable. - Patient is doing water aerobics to help.  -Chronic and stable  PLAN -Lab and f/u in 12 months with NP Lacie  -Mammogram and DEXA in 2 weeks at [facility] -I will call her with lab results  No problem-specific Assessment & Plan notes found for this encounter.   Orders Placed This Encounter  Procedures  . DG Bone Density    Standing Status:   Future    Standing Expiration Date:   10/25/2019    Scheduling Instructions:     [facility]    Order Specific Question:   Reason for Exam (SYMPTOM  OR DIAGNOSIS REQUIRED)    Answer:   screening    Order Specific Question:   Preferred imaging location?    Answer:   External     All questions were answered. The patient knows to call the clinic with any problems, questions or concerns. No barriers to learning was detected.  I spent 15 minutes counseling the patient face to face. The total time spent in the appointment was 20  minutes and more than 50% was on counseling and review of test results  I, Noor Dweik am acting as scribe for Dr. Truitt Merle.  I have reviewed the above documentation for accuracy and completeness, and I agree with the above.    Truitt Merle, MD 10/25/2018

## 2018-10-25 ENCOUNTER — Inpatient Hospital Stay: Payer: Medicare Other | Attending: Hematology | Admitting: Hematology

## 2018-10-25 ENCOUNTER — Encounter: Payer: Self-pay | Admitting: Hematology

## 2018-10-25 ENCOUNTER — Telehealth: Payer: Self-pay

## 2018-10-25 ENCOUNTER — Inpatient Hospital Stay: Payer: Medicare Other

## 2018-10-25 VITALS — BP 112/77 | HR 65 | Temp 98.1°F | Resp 19 | Ht 62.0 in | Wt 164.7 lb

## 2018-10-25 DIAGNOSIS — C50412 Malignant neoplasm of upper-outer quadrant of left female breast: Secondary | ICD-10-CM

## 2018-10-25 DIAGNOSIS — M810 Age-related osteoporosis without current pathological fracture: Secondary | ICD-10-CM

## 2018-10-25 DIAGNOSIS — Z170 Estrogen receptor positive status [ER+]: Secondary | ICD-10-CM

## 2018-10-25 DIAGNOSIS — Z853 Personal history of malignant neoplasm of breast: Secondary | ICD-10-CM | POA: Insufficient documentation

## 2018-10-25 DIAGNOSIS — M8580 Other specified disorders of bone density and structure, unspecified site: Secondary | ICD-10-CM | POA: Diagnosis present

## 2018-10-25 DIAGNOSIS — Z79899 Other long term (current) drug therapy: Secondary | ICD-10-CM | POA: Diagnosis not present

## 2018-10-25 LAB — CBC WITH DIFFERENTIAL/PLATELET
ABS IMMATURE GRANULOCYTES: 0.01 10*3/uL (ref 0.00–0.07)
BASOS ABS: 0 10*3/uL (ref 0.0–0.1)
Basophils Relative: 1 %
Eosinophils Absolute: 0.1 10*3/uL (ref 0.0–0.5)
Eosinophils Relative: 2 %
HEMATOCRIT: 39.5 % (ref 36.0–46.0)
HEMOGLOBIN: 12.5 g/dL (ref 12.0–15.0)
IMMATURE GRANULOCYTES: 0 %
LYMPHS ABS: 1.3 10*3/uL (ref 0.7–4.0)
LYMPHS PCT: 25 %
MCH: 28.5 pg (ref 26.0–34.0)
MCHC: 31.6 g/dL (ref 30.0–36.0)
MCV: 90 fL (ref 80.0–100.0)
Monocytes Absolute: 0.5 10*3/uL (ref 0.1–1.0)
Monocytes Relative: 10 %
NEUTROS ABS: 3.3 10*3/uL (ref 1.7–7.7)
NEUTROS PCT: 62 %
NRBC: 0 % (ref 0.0–0.2)
Platelets: 236 10*3/uL (ref 150–400)
RBC: 4.39 MIL/uL (ref 3.87–5.11)
RDW: 14.1 % (ref 11.5–15.5)
WBC: 5.4 10*3/uL (ref 4.0–10.5)

## 2018-10-25 LAB — COMPREHENSIVE METABOLIC PANEL
ALBUMIN: 3.6 g/dL (ref 3.5–5.0)
ALK PHOS: 59 U/L (ref 38–126)
ALT: 10 U/L (ref 0–44)
ANION GAP: 7 (ref 5–15)
AST: 13 U/L — ABNORMAL LOW (ref 15–41)
BILIRUBIN TOTAL: 0.4 mg/dL (ref 0.3–1.2)
BUN: 19 mg/dL (ref 8–23)
CALCIUM: 8.8 mg/dL — AB (ref 8.9–10.3)
CO2: 25 mmol/L (ref 22–32)
Chloride: 107 mmol/L (ref 98–111)
Creatinine, Ser: 0.8 mg/dL (ref 0.44–1.00)
Glucose, Bld: 91 mg/dL (ref 70–99)
POTASSIUM: 3.9 mmol/L (ref 3.5–5.1)
Sodium: 139 mmol/L (ref 135–145)
TOTAL PROTEIN: 7.1 g/dL (ref 6.5–8.1)

## 2018-10-25 NOTE — Telephone Encounter (Signed)
Printed avs and calender of upcoming appointment. Per 11/20 los 

## 2018-10-26 ENCOUNTER — Telehealth: Payer: Self-pay

## 2018-10-26 LAB — VITAMIN D 25 HYDROXY (VIT D DEFICIENCY, FRACTURES): VIT D 25 HYDROXY: 29.4 ng/mL — AB (ref 30.0–100.0)

## 2018-10-26 NOTE — Telephone Encounter (Signed)
Spoke with patient with lab results per Dr. Burr Medico calcium and Vit D slightly low, instructed patient to take a Calcium and Vitamin D supplement, she states she will start on this.

## 2018-10-26 NOTE — Telephone Encounter (Signed)
-----   Message from Truitt Merle, MD sent at 10/25/2018 10:15 PM EST ----- Please let her know the CMP and VitD result, Calcium slightly low, make sure she is taking calcium and Vit D, thanks  Truitt Merle  10/25/2018

## 2018-10-30 ENCOUNTER — Telehealth: Payer: Self-pay

## 2018-10-30 NOTE — Telephone Encounter (Signed)
Spoke with patient per Dr. Burr Medico notified her Vitamin D level is slightly low, instructed her to double her current dose.  Patient verbalized an understanding and agreed to adjust her dose.

## 2018-10-30 NOTE — Telephone Encounter (Signed)
-----   Message from Truitt Merle, MD sent at 10/29/2018  6:16 PM EST ----- Please let pt know the lab results, VitD slightly low, please increase VitD supplement (double her dose)  Thanks  Truitt Merle  10/29/2018

## 2018-11-07 ENCOUNTER — Encounter: Payer: Self-pay | Admitting: Hematology

## 2018-11-08 ENCOUNTER — Telehealth: Payer: Self-pay

## 2018-11-08 NOTE — Telephone Encounter (Signed)
Faxed order for mammogram to [facility], sent to HIM for scanning to chart.

## 2018-11-13 ENCOUNTER — Telehealth: Payer: Self-pay

## 2018-11-13 NOTE — Telephone Encounter (Signed)
Spoke with patient regarding Bone Density scan results show osteopenia, patient states she is aware and will continue taking her Calcium 600 and Vitamin D.  She states she has to repeat the mammogram on the right side on 12/18, made Dr. Burr Medico aware.

## 2018-11-22 ENCOUNTER — Encounter: Payer: Self-pay | Admitting: Hematology

## 2019-10-23 NOTE — Progress Notes (Signed)
[place]   Telephone:(336) [phone] Fax:(336) [phone]   Clinic Follow up Note   Patient Care Team: [name], DO as PCP - General (Family Medicine) [name], DO as Consulting Physician (Family Medicine) Avon Gully, NP as Nurse Practitioner (Obstetrics and Gynecology) Thea Silversmith, MD as Consulting Physician (Radiation Oncology) Gatha Mayer, MD as Consulting Physician (Radiation Oncology) Marcy Panning, MD as Consulting Physician (Hematology and Oncology) 10/24/2019  CHIEF COMPLAINT: F/u left breast cancer   SUMMARY OF ONCOLOGIC HISTORY: Oncology History  Malignant neoplasm of upper-outer quadrant of left breast in female, estrogen receptor positive ([place])  07/20/2012 Initial Biopsy   Biopsy found  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   07/21/2012 Initial Diagnosis   Malignant neoplasm of upper-outer quadrant of left breast in female, estrogen receptor positive ([place])   08/11/2012 Pathology Results   Final pathology showed  1.7 cm grade 1 with DCIS low to intermediate grade, margins negative and one sentinel node negative    - 11/17/2012 Radiation Therapy   Completed in [place] by Dr. Orlene Erm    Oncotype testing   Dx score 18 = risk of recurrence 11%.   11/2012 -  Anti-estrogen oral therapy   Began Anastrozole.   Was held x2 weeks spring 2017, with full resolution of arthralgias, then resumed   Plan to complete in 11/2017     CURRENT THERAPY: Surveillance; Anastrozole 57m daily started in 11/2012 completed in 11/2017.   INTERVAL HISTORY: Ms. KArenivasreturns for f/u as scheduled. She was seen by Dr. FBurr Medicoon 10/25/2018. 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 Stitches feel tight but not painful. She has no concerns in her breasts such as new lump or nipple change. She remains active, golfs and walks. She sees regular providers. No change in  weight or appetite. Denies change in bowel habits, GI/GYN bleeding, new bone, back or abdominal pain other than minor aches and pains. Denies fever, chills, cough, chest pain, dyspnea, or new leg swelling. [facility]    MEDICAL HISTORY:  Past Medical History:  Diagnosis Date  . Arthritis   . Back pain   . Breast cancer ([place]   . Dribbling of urine   . Fatigue   . GERD (gastroesophageal reflux disease)   . Headache(784.0)   . Osteoporosis, unspecified 07/16/2013  . Ringing in ears   . Sinus complaint   . Skin cancer     SURGICAL HISTORY: Past Surgical History:  Procedure Laterality Date  . BREAST LUMPECTOMY  08/21/12  . COLONOSCOPY    . EYE SURGERY  1986   lt cataract  . TUBAL LIGATION  1982    I have reviewed the social history and family history with the patient and they are unchanged from previous note.  ALLERGIES:  has No Known Allergies.  MEDICATIONS:  Current Outpatient Medications  Medication Sig Dispense Refill  . ALPRAZolam (XANAX) 0.5 MG tablet Take 0.5 mg by mouth at bedtime as needed.     . Calcium Carb-Cholecalciferol (CALCIUM 600 + D PO) Take 1 tablet by mouth 2 (two) times daily.    . fluticasone (FLONASE) 50 MCG/ACT nasal spray     . ibuprofen (ADVIL,MOTRIN) 200 MG tablet Take 200 mg by mouth every 6 (six) hours as needed.    . Pseudoephedrine HCl (SUDAFED CONGESTION PO) Take 2 tablets by mouth 2 (two) times  daily as needed.    . zolpidem (AMBIEN) 10 MG tablet Take 5 mg by mouth at bedtime as needed.     . Probiotic Product (PROBIOTIC-10 PO) Take by mouth.     No current facility-administered medications for this visit.     PHYSICAL EXAMINATION: ECOG PERFORMANCE STATUS: 0 - Asymptomatic  Vitals:   10/24/19 1127  BP: 115/79  Pulse: 65  Resp: 17  Temp: 98 F (36.7 C)  SpO2: 96%   Filed Weights   10/24/19 1127  Weight: 172 lb 6.4 oz (78.2 kg)    GENERAL:alert, no distress and comfortable SKIN: no rash  EYES: sclera  clear LYMPH:  no palpable cervical or supraclavicular lymphadenopathy  LUNGS: clear with normal breathing effort HEART: regular rate & rhythm, no lower extremity edema ABDOMEN: adomen soft, non-tender and normal bowel sounds NEURO: alert & oriented x 3 with fluent speech Breast exam: s/p left lumpectomy. Mild scar tissue beneath healed axillary incision. No palpable mass in either breast or axilla that I could appreciate.   LABORATORY DATA:  I have reviewed the data as listed CBC Latest Ref Rng & Units 10/24/2019 10/25/2018 11/04/2017  WBC 4.0 - 10.5 K/uL 6.3 5.4 5.1  Hemoglobin 12.0 - 15.0 g/dL 13.7 12.5 12.9  Hematocrit 36.0 - 46.0 % 43.8 39.5 40.7  Platelets 150 - 400 K/uL 277 236 268     CMP Latest Ref Rng & Units 10/24/2019 10/25/2018 11/04/2017  Glucose 70 - 99 mg/dL 92 91 99  BUN 8 - 23 mg/dL 20 19 19.5  Creatinine 0.44 - 1.00 mg/dL 0.80 0.80 0.9  Sodium 135 - 145 mmol/L 141 139 139  Potassium 3.5 - 5.1 mmol/L 3.9 3.9 3.5  Chloride 98 - 111 mmol/L 106 107 -  CO2 22 - 32 mmol/L '24 25 24  ' Calcium 8.9 - 10.3 mg/dL 9.2 8.8(L) 9.1  Total Protein 6.5 - 8.1 g/dL 7.4 7.1 7.6  Total Bilirubin 0.3 - 1.2 mg/dL 0.4 0.4 0.32  Alkaline Phos 38 - 126 U/L 65 59 62  AST 15 - 41 U/L 15 13(L) 18  ALT 0 - 44 U/L '16 10 14      ' RADIOGRAPHIC STUDIES: I have personally reviewed the radiological images as listed and agreed with the findings in the report. No results found.   ASSESSMENT & PLAN:  73 y.o.  female  1. Malignant neoplasm of upper-outer quadrant of left breast, invasive ductal carcinoma, G1, T1N0 stage IA, ER+/ PR + HER 2 - BRCA1/2 negative (07/26/2012) -She was diagnosed 07/2012, treated with lumpectomy in 08/2012 and adjuvant radiation. She completed adjuvant anastrozole from 11/2012 - 11/2017.  -Kristina Curry is clinically doing well. Breast exam is benign. CBC and CMP are normal. No clinical concern for recurrence. Her annual mammogram in 11/2018 was negative, she has one coming up  on 11/22/2019.  -she prefers to continue surveillance with our office.  -she will return for lab and f/u with Dr. Burr Medico in 1 year; she continues f/u with PCP and other specialists as needed  -I encouraged her to remain active, eat healthy, hydrate, and avoid smoking  2. Osteopenia - she takes calcium and vitamin D -Bone density scan on 11/07/2018 showed T score -1.9 in RFN, she has osteopenia -Vitamin D level is pending today -next DEXA in 11/2020  3. Basal skin cancer -removed from LLE on 10/23/19 per [facility], will request the report.   PLAN: -Labs reviewed -No clinical concern for breast cancer recurrence -Screening mamm[facility] 11/22/19 at [facility] -  Lab, f/u with Dr. Burr Medico in 1 year -Will request derm pathology  -Continue f/u with PCP   All questions were answered. The patient knows to call the clinic with any problems, questions or concerns. No barriers to learning was detected.     Alla Feeling, NP 10/24/19

## 2019-10-24 ENCOUNTER — Telehealth: Payer: Self-pay | Admitting: Nurse Practitioner

## 2019-10-24 ENCOUNTER — Inpatient Hospital Stay: Payer: Medicare Other | Attending: Hematology

## 2019-10-24 ENCOUNTER — Encounter: Payer: Self-pay | Admitting: Nurse Practitioner

## 2019-10-24 ENCOUNTER — Other Ambulatory Visit: Payer: Self-pay

## 2019-10-24 ENCOUNTER — Inpatient Hospital Stay (HOSPITAL_BASED_OUTPATIENT_CLINIC_OR_DEPARTMENT_OTHER): Payer: Medicare Other | Admitting: Nurse Practitioner

## 2019-10-24 VITALS — BP 115/79 | HR 65 | Temp 98.0°F | Resp 17 | Ht 62.0 in | Wt 172.4 lb

## 2019-10-24 DIAGNOSIS — Z170 Estrogen receptor positive status [ER+]: Secondary | ICD-10-CM | POA: Insufficient documentation

## 2019-10-24 DIAGNOSIS — M810 Age-related osteoporosis without current pathological fracture: Secondary | ICD-10-CM

## 2019-10-24 DIAGNOSIS — C50412 Malignant neoplasm of upper-outer quadrant of left female breast: Secondary | ICD-10-CM

## 2019-10-24 DIAGNOSIS — C44719 Basal cell carcinoma of skin of left lower limb, including hip: Secondary | ICD-10-CM | POA: Insufficient documentation

## 2019-10-24 LAB — CBC WITH DIFFERENTIAL/PLATELET
Abs Immature Granulocytes: 0.01 10*3/uL (ref 0.00–0.07)
Basophils Absolute: 0.1 10*3/uL (ref 0.0–0.1)
Basophils Relative: 1 %
Eosinophils Absolute: 0.1 10*3/uL (ref 0.0–0.5)
Eosinophils Relative: 2 %
HCT: 43.8 % (ref 36.0–46.0)
Hemoglobin: 13.7 g/dL (ref 12.0–15.0)
Immature Granulocytes: 0 %
Lymphocytes Relative: 24 %
Lymphs Abs: 1.5 10*3/uL (ref 0.7–4.0)
MCH: 28.8 pg (ref 26.0–34.0)
MCHC: 31.3 g/dL (ref 30.0–36.0)
MCV: 92.2 fL (ref 80.0–100.0)
Monocytes Absolute: 0.8 10*3/uL (ref 0.1–1.0)
Monocytes Relative: 12 %
Neutro Abs: 3.8 10*3/uL (ref 1.7–7.7)
Neutrophils Relative %: 61 %
Platelets: 277 10*3/uL (ref 150–400)
RBC: 4.75 MIL/uL (ref 3.87–5.11)
RDW: 14.6 % (ref 11.5–15.5)
WBC: 6.3 10*3/uL (ref 4.0–10.5)
nRBC: 0 % (ref 0.0–0.2)

## 2019-10-24 LAB — COMPREHENSIVE METABOLIC PANEL
ALT: 16 U/L (ref 0–44)
AST: 15 U/L (ref 15–41)
Albumin: 3.9 g/dL (ref 3.5–5.0)
Alkaline Phosphatase: 65 U/L (ref 38–126)
Anion gap: 11 (ref 5–15)
BUN: 20 mg/dL (ref 8–23)
CO2: 24 mmol/L (ref 22–32)
Calcium: 9.2 mg/dL (ref 8.9–10.3)
Chloride: 106 mmol/L (ref 98–111)
Creatinine, Ser: 0.8 mg/dL (ref 0.44–1.00)
GFR calc Af Amer: >60 mL/min (ref >60)
GFR calc non Af Amer: >60 mL/min (ref >60)
Glucose, Bld: 92 mg/dL (ref 70–99)
Potassium: 3.9 mmol/L (ref 3.5–5.1)
Sodium: 141 mmol/L (ref 135–145)
Total Bilirubin: 0.4 mg/dL (ref 0.3–1.2)
Total Protein: 7.4 g/dL (ref 6.5–8.1)

## 2019-10-24 LAB — VITAMIN D 25 HYDROXY (VIT D DEFICIENCY, FRACTURES): Vit D, 25-Hydroxy: 21.18 ng/mL — ABNORMAL LOW (ref 30–100)

## 2019-10-24 NOTE — Telephone Encounter (Signed)
Scheduled appt per 11/18 los.  Spoke with pt and she is aware of the appt date and time.

## 2019-10-29 ENCOUNTER — Telehealth: Payer: Self-pay

## 2019-10-29 NOTE — Telephone Encounter (Signed)
Spoke with patient regarding lab results.  Per Dr. Mal Amabile D level was low last week.  Instructed her to take an additional Vitamin D 2000U daily in additional to what she is on now.  She verbalized an understanding.

## 2019-10-29 NOTE — Telephone Encounter (Signed)
-----   Message from Truitt Merle, MD sent at 10/29/2019 12:47 PM EST ----- Please let pt know her vitD level was low last week, and I recommend additional VitD 2000u daily in additional to what she is on now, thanks   [employer]  10/29/2019

## 2020-03-11 ENCOUNTER — Encounter: Payer: Self-pay | Admitting: Nurse Practitioner

## 2020-03-11 NOTE — Progress Notes (Signed)
Late entry: Review of the lower leg skin biopsy from 10/23/2019 showed postsurgical scar, no neoplasm is identified. No further actions or orders.   Cira Rue, NP

## 2020-05-26 ENCOUNTER — Other Ambulatory Visit: Payer: Self-pay

## 2020-05-26 ENCOUNTER — Encounter (HOSPITAL_COMMUNITY): Payer: Self-pay

## 2020-05-26 DIAGNOSIS — Z7982 Long term (current) use of aspirin: Secondary | ICD-10-CM | POA: Diagnosis not present

## 2020-05-26 DIAGNOSIS — W010XXA Fall on same level from slipping, tripping and stumbling without subsequent striking against object, initial encounter: Secondary | ICD-10-CM | POA: Diagnosis not present

## 2020-05-26 DIAGNOSIS — M810 Age-related osteoporosis without current pathological fracture: Secondary | ICD-10-CM | POA: Insufficient documentation

## 2020-05-26 DIAGNOSIS — Z853 Personal history of malignant neoplasm of breast: Secondary | ICD-10-CM | POA: Insufficient documentation

## 2020-05-26 DIAGNOSIS — E669 Obesity, unspecified: Secondary | ICD-10-CM | POA: Diagnosis not present

## 2020-05-26 DIAGNOSIS — Z79899 Other long term (current) drug therapy: Secondary | ICD-10-CM | POA: Insufficient documentation

## 2020-05-26 DIAGNOSIS — S82002A Unspecified fracture of left patella, initial encounter for closed fracture: Principal | ICD-10-CM | POA: Insufficient documentation

## 2020-05-26 DIAGNOSIS — Z20822 Contact with and (suspected) exposure to covid-19: Secondary | ICD-10-CM | POA: Diagnosis not present

## 2020-05-26 DIAGNOSIS — Z87891 Personal history of nicotine dependence: Secondary | ICD-10-CM | POA: Diagnosis not present

## 2020-05-26 DIAGNOSIS — Z6831 Body mass index (BMI) 31.0-31.9, adult: Secondary | ICD-10-CM | POA: Insufficient documentation

## 2020-05-26 DIAGNOSIS — M1990 Unspecified osteoarthritis, unspecified site: Secondary | ICD-10-CM | POA: Diagnosis not present

## 2020-05-26 DIAGNOSIS — K219 Gastro-esophageal reflux disease without esophagitis: Secondary | ICD-10-CM | POA: Diagnosis not present

## 2020-05-26 DIAGNOSIS — Z85828 Personal history of other malignant neoplasm of skin: Secondary | ICD-10-CM | POA: Insufficient documentation

## 2020-05-26 NOTE — ED Triage Notes (Signed)
Patient arrived stating she had a fall this morning and was told she had a broken knee cap. States she was told to come here to be admitted because she cannot manage to care for herself at home with her injury.

## 2020-05-27 ENCOUNTER — Encounter (HOSPITAL_COMMUNITY)
Admission: EM | Disposition: A | Discharge status: Home / Self Care | Payer: Self-pay | Source: Home / Self Care | Attending: Emergency Medicine

## 2020-05-27 ENCOUNTER — Emergency Department (HOSPITAL_COMMUNITY): Payer: Medicare Other | Admitting: Certified Registered"

## 2020-05-27 ENCOUNTER — Observation Stay (HOSPITAL_COMMUNITY)
Admission: EM | Admit: 2020-05-27 | Discharge: 2020-05-29 | Disposition: A | Discharge status: Home / Self Care | Payer: Medicare Other | Attending: Orthopedic Surgery | Admitting: Orthopedic Surgery

## 2020-05-27 ENCOUNTER — Emergency Department (HOSPITAL_COMMUNITY): Payer: Medicare Other

## 2020-05-27 ENCOUNTER — Encounter (HOSPITAL_COMMUNITY): Payer: Self-pay | Admitting: Anesthesiology

## 2020-05-27 DIAGNOSIS — S82002A Unspecified fracture of left patella, initial encounter for closed fracture: Secondary | ICD-10-CM

## 2020-05-27 DIAGNOSIS — E669 Obesity, unspecified: Secondary | ICD-10-CM | POA: Diagnosis not present

## 2020-05-27 DIAGNOSIS — M1990 Unspecified osteoarthritis, unspecified site: Secondary | ICD-10-CM | POA: Diagnosis not present

## 2020-05-27 DIAGNOSIS — Z20822 Contact with and (suspected) exposure to covid-19: Secondary | ICD-10-CM | POA: Diagnosis not present

## 2020-05-27 HISTORY — PX: ORIF PATELLA: SHX5033

## 2020-05-27 LAB — BASIC METABOLIC PANEL
Anion gap: 10 (ref 5–15)
BUN: 18 mg/dL (ref 8–23)
CO2: 23 mmol/L (ref 22–32)
Calcium: 8.4 mg/dL — ABNORMAL LOW (ref 8.9–10.3)
Chloride: 105 mmol/L (ref 98–111)
Creatinine, Ser: 0.71 mg/dL (ref 0.44–1.00)
GFR calc Af Amer: >60 mL/min (ref >60)
GFR calc non Af Amer: >60 mL/min (ref >60)
Glucose, Bld: 115 mg/dL — ABNORMAL HIGH (ref 70–99)
Potassium: 3.5 mmol/L (ref 3.5–5.1)
Sodium: 138 mmol/L (ref 135–145)

## 2020-05-27 LAB — CBC WITH DIFFERENTIAL/PLATELET
Abs Immature Granulocytes: 0.04 10*3/uL (ref 0.00–0.07)
Basophils Absolute: 0 10*3/uL (ref 0.0–0.1)
Basophils Relative: 0 %
Eosinophils Absolute: 0.1 10*3/uL (ref 0.0–0.5)
Eosinophils Relative: 1 %
HCT: 39.9 % (ref 36.0–46.0)
Hemoglobin: 13 g/dL (ref 12.0–15.0)
Immature Granulocytes: 0 %
Lymphocytes Relative: 16 %
Lymphs Abs: 1.6 10*3/uL (ref 0.7–4.0)
MCH: 29.5 pg (ref 26.0–34.0)
MCHC: 32.6 g/dL (ref 30.0–36.0)
MCV: 90.5 fL (ref 80.0–100.0)
Monocytes Absolute: 1 10*3/uL (ref 0.1–1.0)
Monocytes Relative: 10 %
Neutro Abs: 7 10*3/uL (ref 1.7–7.7)
Neutrophils Relative %: 73 %
Platelets: 226 10*3/uL (ref 150–400)
RBC: 4.41 MIL/uL (ref 3.87–5.11)
RDW: 15.3 % (ref 11.5–15.5)
WBC: 9.7 10*3/uL (ref 4.0–10.5)
nRBC: 0 % (ref 0.0–0.2)

## 2020-05-27 LAB — URINALYSIS, ROUTINE W REFLEX MICROSCOPIC
Bacteria, UA: NONE SEEN
Bilirubin Urine: NEGATIVE
Glucose, UA: NEGATIVE mg/dL
Ketones, ur: NEGATIVE mg/dL
Leukocytes,Ua: NEGATIVE
Nitrite: NEGATIVE
Protein, ur: NEGATIVE mg/dL
Specific Gravity, Urine: 1.015 (ref 1.005–1.030)
pH: 5 (ref 5.0–8.0)

## 2020-05-27 LAB — SARS CORONAVIRUS 2 BY RT PCR (HOSPITAL ORDER, PERFORMED IN ~~LOC~~ HOSPITAL LAB): SARS Coronavirus 2: NEGATIVE

## 2020-05-27 SURGERY — OPEN REDUCTION INTERNAL FIXATION (ORIF) PATELLA
Anesthesia: General | Site: Knee | Laterality: Left

## 2020-05-27 MED ORDER — DEXAMETHASONE SODIUM PHOSPHATE 10 MG/ML IJ SOLN
INTRAMUSCULAR | Status: DC | PRN
  Administered 2020-05-27: 5 mg via INTRAVENOUS

## 2020-05-27 MED ORDER — POVIDONE-IODINE 10 % EX SWAB
2.0000 "application " | Freq: Once | CUTANEOUS | Status: AC
  Administered 2020-05-27: 2 via TOPICAL

## 2020-05-27 MED ORDER — PROPOFOL 500 MG/50ML IV EMUL
INTRAVENOUS | Status: AC
  Filled 2020-05-27: qty 50

## 2020-05-27 MED ORDER — MIDAZOLAM HCL 2 MG/2ML IJ SOLN
INTRAMUSCULAR | Status: AC
  Filled 2020-05-27: qty 2

## 2020-05-27 MED ORDER — DEXAMETHASONE SODIUM PHOSPHATE 10 MG/ML IJ SOLN
10.0000 mg | Freq: Once | INTRAMUSCULAR | Status: AC
  Administered 2020-05-28: 10 mg via INTRAVENOUS
  Filled 2020-05-27: qty 1

## 2020-05-27 MED ORDER — ONDANSETRON HCL 4 MG/2ML IJ SOLN
INTRAMUSCULAR | Status: AC
  Filled 2020-05-27: qty 2

## 2020-05-27 MED ORDER — DEXTROSE 5 % IV SOLN: 3.0000 g | INTRAVENOUS | Status: DC

## 2020-05-27 MED ORDER — DIPHENHYDRAMINE HCL 12.5 MG/5ML PO ELIX: 12.5000 mg | ORAL_SOLUTION | ORAL | Status: DC | PRN

## 2020-05-27 MED ORDER — ALUM & MAG HYDROXIDE-SIMETH 200-200-20 MG/5ML PO SUSP
15.0000 mL | ORAL | Status: DC | PRN
  Filled 2020-05-27: qty 30

## 2020-05-27 MED ORDER — METHOCARBAMOL 500 MG PO TABS
500.0000 mg | ORAL_TABLET | Freq: Four times a day (QID) | ORAL | Status: DC | PRN
  Administered 2020-05-27 – 2020-05-29 (×4): 500 mg via ORAL
  Filled 2020-05-27 (×4): qty 1

## 2020-05-27 MED ORDER — SODIUM CHLORIDE 0.9 % IV SOLN: INTRAVENOUS | Status: AC

## 2020-05-27 MED ORDER — FENTANYL CITRATE (PF) 100 MCG/2ML IJ SOLN
INTRAMUSCULAR | Status: AC
  Filled 2020-05-27: qty 2

## 2020-05-27 MED ORDER — METHOCARBAMOL 500 MG IVPB - SIMPLE MED
500.0000 mg | Freq: Four times a day (QID) | INTRAVENOUS | Status: DC | PRN
  Filled 2020-05-27: qty 50

## 2020-05-27 MED ORDER — MENTHOL 3 MG MT LOZG: 1.0000 | LOZENGE | OROMUCOSAL | Status: DC | PRN

## 2020-05-27 MED ORDER — PHENOL 1.4 % MT LIQD: 1.0000 | OROMUCOSAL | Status: DC | PRN

## 2020-05-27 MED ORDER — EPHEDRINE 5 MG/ML INJ
INTRAVENOUS | Status: AC
  Filled 2020-05-27: qty 10

## 2020-05-27 MED ORDER — MEPERIDINE HCL 50 MG/ML IJ SOLN: 6.2500 mg | INTRAMUSCULAR | Status: DC | PRN

## 2020-05-27 MED ORDER — ZOLPIDEM TARTRATE 5 MG PO TABS: 5.0000 mg | ORAL_TABLET | Freq: Every evening | ORAL | Status: DC | PRN

## 2020-05-27 MED ORDER — METOCLOPRAMIDE HCL 5 MG PO TABS: 5.0000 mg | ORAL_TABLET | Freq: Three times a day (TID) | ORAL | Status: DC | PRN

## 2020-05-27 MED ORDER — FENTANYL CITRATE (PF) 100 MCG/2ML IJ SOLN
25.0000 ug | INTRAMUSCULAR | Status: DC | PRN
  Administered 2020-05-27 (×2): 50 ug via INTRAVENOUS

## 2020-05-27 MED ORDER — BISACODYL 10 MG RE SUPP: 10.0000 mg | Freq: Every day | RECTAL | Status: DC | PRN

## 2020-05-27 MED ORDER — SODIUM CHLORIDE 0.9 % IR SOLN
Status: DC | PRN
  Administered 2020-05-27: 1000 mL

## 2020-05-27 MED ORDER — MAGNESIUM CITRATE PO SOLN: 1.0000 | Freq: Once | ORAL | Status: DC | PRN

## 2020-05-27 MED ORDER — METOCLOPRAMIDE HCL 5 MG/ML IJ SOLN: 5.0000 mg | Freq: Three times a day (TID) | INTRAMUSCULAR | Status: DC | PRN

## 2020-05-27 MED ORDER — CHLORHEXIDINE GLUCONATE 0.12 % MT SOLN
15.0000 mL | OROMUCOSAL | Status: AC
  Administered 2020-05-27: 15 mL via OROMUCOSAL

## 2020-05-27 MED ORDER — EPHEDRINE SULFATE-NACL 50-0.9 MG/10ML-% IV SOSY
PREFILLED_SYRINGE | INTRAVENOUS | Status: DC | PRN
  Administered 2020-05-27 (×2): 5 mg via INTRAVENOUS

## 2020-05-27 MED ORDER — FENTANYL CITRATE (PF) 100 MCG/2ML IJ SOLN
INTRAMUSCULAR | Status: DC | PRN
  Administered 2020-05-27 (×4): 25 ug via INTRAVENOUS
  Administered 2020-05-27: 50 ug via INTRAVENOUS
  Administered 2020-05-27 (×4): 25 ug via INTRAVENOUS

## 2020-05-27 MED ORDER — HYDROCODONE-ACETAMINOPHEN 5-325 MG PO TABS
1.0000 | ORAL_TABLET | ORAL | Status: DC | PRN
  Administered 2020-05-27 – 2020-05-29 (×2): 2 via ORAL
  Filled 2020-05-27 (×2): qty 2

## 2020-05-27 MED ORDER — ROPIVACAINE HCL 7.5 MG/ML IJ SOLN
INTRAMUSCULAR | Status: DC | PRN
Start: 2020-05-27 — End: 2020-05-27
  Administered 2020-05-27: 20 mL via PERINEURAL

## 2020-05-27 MED ORDER — ONDANSETRON HCL 4 MG/2ML IJ SOLN
4.0000 mg | Freq: Three times a day (TID) | INTRAMUSCULAR | Status: DC | PRN
  Administered 2020-05-27: 4 mg via INTRAVENOUS
  Filled 2020-05-27: qty 2

## 2020-05-27 MED ORDER — MORPHINE SULFATE (PF) 2 MG/ML IV SOLN: 0.5000 mg | INTRAVENOUS | Status: DC | PRN

## 2020-05-27 MED ORDER — ONDANSETRON HCL 4 MG/2ML IJ SOLN: 4.0000 mg | Freq: Once | INTRAMUSCULAR | Status: DC | PRN

## 2020-05-27 MED ORDER — ACETAMINOPHEN 500 MG PO TABS
500.0000 mg | ORAL_TABLET | Freq: Four times a day (QID) | ORAL | Status: AC
  Administered 2020-05-28 (×3): 500 mg via ORAL
  Filled 2020-05-27 (×3): qty 1

## 2020-05-27 MED ORDER — ONDANSETRON HCL 4 MG PO TABS: 4.0000 mg | ORAL_TABLET | Freq: Four times a day (QID) | ORAL | Status: DC | PRN

## 2020-05-27 MED ORDER — FLUTICASONE PROPIONATE 50 MCG/ACT NA SUSP
1.0000 | Freq: Every day | NASAL | Status: DC
  Filled 2020-05-27: qty 16

## 2020-05-27 MED ORDER — ACETAMINOPHEN 325 MG PO TABS
325.0000 mg | ORAL_TABLET | Freq: Four times a day (QID) | ORAL | Status: DC | PRN
  Administered 2020-05-28 – 2020-05-29 (×2): 650 mg via ORAL
  Filled 2020-05-27 (×2): qty 2

## 2020-05-27 MED ORDER — CHLORHEXIDINE GLUCONATE 4 % EX LIQD: 60.0000 mL | Freq: Once | CUTANEOUS | Status: DC

## 2020-05-27 MED ORDER — PROPOFOL 500 MG/50ML IV EMUL
INTRAVENOUS | Status: DC | PRN
  Administered 2020-05-27: 50 ug/kg/min via INTRAVENOUS

## 2020-05-27 MED ORDER — ONDANSETRON HCL 4 MG/2ML IJ SOLN: 4.0000 mg | Freq: Four times a day (QID) | INTRAMUSCULAR | Status: DC | PRN

## 2020-05-27 MED ORDER — ONDANSETRON HCL 4 MG/2ML IJ SOLN
INTRAMUSCULAR | Status: DC | PRN
  Administered 2020-05-27: 4 mg via INTRAVENOUS

## 2020-05-27 MED ORDER — CEFAZOLIN SODIUM-DEXTROSE 2-4 GM/100ML-% IV SOLN
2.0000 g | Freq: Four times a day (QID) | INTRAVENOUS | Status: AC
  Administered 2020-05-27 – 2020-05-28 (×2): 2 g via INTRAVENOUS
  Filled 2020-05-27 (×2): qty 100

## 2020-05-27 MED ORDER — DEXAMETHASONE SODIUM PHOSPHATE 10 MG/ML IJ SOLN
INTRAMUSCULAR | Status: AC
  Filled 2020-05-27: qty 1

## 2020-05-27 MED ORDER — PROPOFOL 10 MG/ML IV BOLUS
INTRAVENOUS | Status: DC | PRN
  Administered 2020-05-27: 120 mg via INTRAVENOUS

## 2020-05-27 MED ORDER — POLYETHYLENE GLYCOL 3350 17 G PO PACK
17.0000 g | PACK | Freq: Two times a day (BID) | ORAL | Status: DC
  Administered 2020-05-28 – 2020-05-29 (×2): 17 g via ORAL
  Filled 2020-05-27 (×4): qty 1

## 2020-05-27 MED ORDER — ASPIRIN 81 MG PO CHEW
81.0000 mg | CHEWABLE_TABLET | Freq: Two times a day (BID) | ORAL | Status: DC
  Administered 2020-05-27 – 2020-05-29 (×4): 81 mg via ORAL
  Filled 2020-05-27 (×4): qty 1

## 2020-05-27 MED ORDER — SODIUM CHLORIDE 0.9 % IV SOLN: INTRAVENOUS | Status: DC

## 2020-05-27 MED ORDER — MIDAZOLAM HCL 5 MG/5ML IJ SOLN
INTRAMUSCULAR | Status: DC | PRN
  Administered 2020-05-27: 1 mg via INTRAVENOUS

## 2020-05-27 MED ORDER — CEFAZOLIN SODIUM-DEXTROSE 2-4 GM/100ML-% IV SOLN
2.0000 g | INTRAVENOUS | Status: AC
  Administered 2020-05-27: 2 g via INTRAVENOUS
  Filled 2020-05-27: qty 100

## 2020-05-27 MED ORDER — OXYCODONE HCL 5 MG/5ML PO SOLN: 5.0000 mg | Freq: Once | ORAL | Status: DC | PRN

## 2020-05-27 MED ORDER — HYDROCODONE-ACETAMINOPHEN 7.5-325 MG PO TABS: 1.0000 | ORAL_TABLET | ORAL | Status: DC | PRN

## 2020-05-27 MED ORDER — OXYCODONE HCL 5 MG PO TABS: 5.0000 mg | ORAL_TABLET | Freq: Once | ORAL | Status: DC | PRN

## 2020-05-27 MED ORDER — DOCUSATE SODIUM 100 MG PO CAPS
100.0000 mg | ORAL_CAPSULE | Freq: Two times a day (BID) | ORAL | Status: DC
  Administered 2020-05-28 – 2020-05-29 (×2): 100 mg via ORAL
  Filled 2020-05-27 (×4): qty 1

## 2020-05-27 MED ORDER — ALPRAZOLAM 1 MG PO TABS
1.0000 mg | ORAL_TABLET | Freq: Every evening | ORAL | Status: DC | PRN
  Administered 2020-05-27 – 2020-05-28 (×2): 1 mg via ORAL
  Filled 2020-05-27 (×2): qty 1

## 2020-05-27 MED ORDER — MORPHINE SULFATE (PF) 4 MG/ML IV SOLN
4.0000 mg | INTRAVENOUS | Status: DC | PRN
  Administered 2020-05-27: 4 mg via INTRAVENOUS
  Filled 2020-05-27: qty 1

## 2020-05-27 MED ORDER — LACTATED RINGERS IV SOLN: INTRAVENOUS | Status: DC

## 2020-05-27 MED ORDER — PANTOPRAZOLE SODIUM 40 MG IV SOLR
40.0000 mg | Freq: Once | INTRAVENOUS | Status: AC
  Administered 2020-05-27: 40 mg via INTRAVENOUS
  Filled 2020-05-27: qty 40

## 2020-05-27 MED ORDER — PANTOPRAZOLE SODIUM 40 MG PO TBEC
40.0000 mg | DELAYED_RELEASE_TABLET | Freq: Every day | ORAL | Status: DC
  Administered 2020-05-28 – 2020-05-29 (×2): 40 mg via ORAL
  Filled 2020-05-27 (×2): qty 1

## 2020-05-27 SURGICAL SUPPLY — 54 items
BAG ZIPLOCK 12X15 (MISCELLANEOUS) ×3 IMPLANT
BLADE SAW SGTL 11.0X1.19X90.0M (BLADE) ×3 IMPLANT
BNDG COHESIVE 6X5 TAN STRL LF (GAUZE/BANDAGES/DRESSINGS) ×3 IMPLANT
BNDG ELASTIC 6X5.8 VLCR STR LF (GAUZE/BANDAGES/DRESSINGS) ×3 IMPLANT
CLOTH BEACON ORANGE TIMEOUT ST (SAFETY) ×3 IMPLANT
COVER SURGICAL LIGHT HANDLE (MISCELLANEOUS) ×3 IMPLANT
COVER WAND RF STERILE (DRAPES) IMPLANT
CUFF TOURN SGL QUICK 34 (TOURNIQUET CUFF) ×2
CUFF TOURN SGL QUICK 42 (TOURNIQUET CUFF) IMPLANT
CUFF TRNQT CYL 34X4.125X (TOURNIQUET CUFF) ×1 IMPLANT
DECANTER SPIKE VIAL GLASS SM (MISCELLANEOUS) IMPLANT
DERMABOND ADVANCED (GAUZE/BANDAGES/DRESSINGS) ×2
DERMABOND ADVANCED .7 DNX12 (GAUZE/BANDAGES/DRESSINGS) ×1 IMPLANT
DRAPE U-SHAPE 47X51 STRL (DRAPES) ×3 IMPLANT
DRESSING AQUACEL AG SP 3.5X10 (GAUZE/BANDAGES/DRESSINGS) ×1 IMPLANT
DRSG AQUACEL AG ADV 3.5X10 (GAUZE/BANDAGES/DRESSINGS) ×3 IMPLANT
DRSG AQUACEL AG SP 3.5X10 (GAUZE/BANDAGES/DRESSINGS) ×3
DURAPREP 26ML APPLICATOR (WOUND CARE) ×3 IMPLANT
ELECT REM PT RETURN 15FT ADLT (MISCELLANEOUS) ×3 IMPLANT
GAUZE SPONGE 4X4 12PLY STRL (GAUZE/BANDAGES/DRESSINGS) ×3 IMPLANT
GLOVE BIOGEL M 7.0 STRL (GLOVE) IMPLANT
GLOVE BIOGEL PI IND STRL 7.5 (GLOVE) ×1 IMPLANT
GLOVE BIOGEL PI IND STRL 8.5 (GLOVE) ×1 IMPLANT
GLOVE BIOGEL PI INDICATOR 7.5 (GLOVE) ×2
GLOVE BIOGEL PI INDICATOR 8.5 (GLOVE) ×2
GLOVE ECLIPSE 8.0 STRL XLNG CF (GLOVE) IMPLANT
GLOVE ORTHO TXT STRL SZ7.5 (GLOVE) ×6 IMPLANT
GLOVE SURG ORTHO 8.0 STRL STRW (GLOVE) ×3 IMPLANT
GOWN STRL REUS W/TWL LRG LVL3 (GOWN DISPOSABLE) ×3 IMPLANT
GOWN STRL REUS W/TWL XL LVL3 (GOWN DISPOSABLE) ×6 IMPLANT
IMMOBILIZER KNEE 20 (SOFTGOODS) ×3
IMMOBILIZER KNEE 20 THIGH 36 (SOFTGOODS) ×1 IMPLANT
KIT TURNOVER KIT A (KITS) IMPLANT
MANIFOLD NEPTUNE II (INSTRUMENTS) ×3 IMPLANT
NS IRRIG 1000ML POUR BTL (IV SOLUTION) ×3 IMPLANT
PACK TOTAL KNEE CUSTOM (KITS) ×3 IMPLANT
PADDING CAST COTTON 6X4 STRL (CAST SUPPLIES) ×3 IMPLANT
PASSER SUT SWANSON 36MM LOOP (INSTRUMENTS) ×6 IMPLANT
PENCIL SMOKE EVACUATOR (MISCELLANEOUS) ×3 IMPLANT
PROTECTOR NERVE ULNAR (MISCELLANEOUS) ×3 IMPLANT
STAPLER VISISTAT (STAPLE) ×3 IMPLANT
SUT ETHIBOND NAB CT1 #1 30IN (SUTURE) ×6 IMPLANT
SUT FIBERWIRE #2 38 T-5 BLUE (SUTURE) ×6
SUT MNCRL AB 4-0 PS2 18 (SUTURE) ×3 IMPLANT
SUT VIC AB 0 CT1 27 (SUTURE) ×6
SUT VIC AB 0 CT1 27XBRD ANTBC (SUTURE) ×2 IMPLANT
SUT VIC AB 1 CT1 27 (SUTURE) ×4
SUT VIC AB 1 CT1 27XBRD ANTBC (SUTURE) ×2 IMPLANT
SUT VIC AB 1 CT1 36 (SUTURE) ×6 IMPLANT
SUT VIC AB 2-0 CT1 27 (SUTURE) ×4
SUT VIC AB 2-0 CT1 TAPERPNT 27 (SUTURE) ×2 IMPLANT
SUTURE FIBERWR #2 38 T-5 BLUE (SUTURE) ×2 IMPLANT
TRAY PREP A LATEX SAFE STRL (SET/KITS/TRAYS/PACK) ×3 IMPLANT
WATER STERILE IRR 1000ML POUR (IV SOLUTION) IMPLANT

## 2020-05-27 NOTE — Anesthesia Procedure Notes (Signed)
Anesthesia Regional Block: Femoral nerve block   Pre-Anesthetic Checklist: ,, timeout performed, Correct Patient, Correct Site, Correct Laterality, Correct Procedure, Correct Position, site marked, Risks and benefits discussed,  Surgical consent,  Pre-op evaluation,  At surgeon's request and post-op pain management  Laterality: Left  Prep: chloraprep       Needles:  Injection technique: Single-shot      Additional Needles:   Procedures:,,,, ultrasound used (permanent image in chart),,,,  Narrative:  Start time: 05/27/2020 12:21 PM End time: 05/27/2020 12:26 PM Injection made incrementally with aspirations every 5 mL.  Performed by: Personally  Anesthesiologist: Josephine Igo, MD  Additional Notes: Timeout performed. Patient sedated. Relevant anatomy ID'd using [place]. Incremental 2-59ml injection of LA with frequent aspiration. Patient tolerated procedure well.        Left Femoral Nerve Block

## 2020-05-27 NOTE — Anesthesia Procedure Notes (Signed)
Procedure Name: LMA Insertion Date/Time: 05/27/2020 12:40 PM Performed by: Gwyndolyn Saxon, CRNA Pre-anesthesia Checklist: Patient identified, Emergency Drugs available, Suction available and Patient being monitored Patient Re-evaluated:Patient Re-evaluated prior to induction Oxygen Delivery Method: Circle system utilized Preoxygenation: Pre-oxygenation with 100% oxygen Induction Type: IV induction Ventilation: Mask ventilation without difficulty LMA: LMA inserted LMA Size: 4.0 Number of attempts: 1 Placement Confirmation: positive ETCO2 and breath sounds checked- equal and bilateral Tube secured with: Tape Dental Injury: Teeth and Oropharynx as per pre-operative assessment

## 2020-05-27 NOTE — ED Provider Notes (Signed)
[place] DEPT Provider Note   CSN: [id number] Arrival date & time: 05/26/20  1900   History Chief Complaint  Patient presents with  . Knee Pain    Kristina Curry is a 74 y.o. female.  The history is provided by the patient.  Knee Pain She has history of breast cancer, GERD and comes in because of left patellar fracture.  She had tripped and fallen outside of her home and landed on both knees and her hands and suffered injury to her left knee.  She went to the emergency department at the hospital in [facility] where she was found to have a patellar fracture.  Unfortunately, they do not have an orthopedic physician available, placed her in a knee immobilizer and discharged her.  Patient had made arrangements to be seen at [facility].  However, patient found that she was unable to manage at home.  She does not have sufficient support.  Therefore, she came here to be admitted pending surgical management of her knee injury.  As long as she does not move her leg, there is no pain.  However, with even slight movement, she has severe pain in her left knee.  Past Medical History:  Diagnosis Date  . Arthritis   . Back pain   . Breast cancer ([place])   . Dribbling of urine   . Fatigue   . GERD (gastroesophageal reflux disease)   . Headache(784.0)   . Osteoporosis, unspecified 07/16/2013  . Ringing in ears   . Sinus complaint   . Skin cancer     Patient Active Problem List   Diagnosis Date Noted  . Arthralgia of hands, bilateral 02/07/2016  . Past use of tobacco 02/07/2016  . Osteopenia determined by x-ray 02/07/2016  . High risk medication use 02/07/2016  . Breast cancer screening, high risk patient 02/07/2016  . Screening mammogram for high-risk patient 07/29/2015  . Estrogen deficiency 07/29/2015  . Former light tobacco smoker 07/29/2015  . Continuous tobacco abuse 01/29/2015  . Osteoporosis 07/16/2013  . Malignant neoplasm of upper-outer  quadrant of left breast in female, estrogen receptor positive ([place]) 07/21/2012    Past Surgical History:  Procedure Laterality Date  . BREAST LUMPECTOMY  08/21/12  . COLONOSCOPY    . EYE SURGERY  1986   lt cataract  . TUBAL LIGATION  1982     OB History   No obstetric history on file.     Family History  Problem Relation Age of Onset  . Breast cancer Mother 64  . Bone cancer Father 22  . Breast cancer Maternal Aunt 80  . Breast cancer Paternal Aunt 88       double mastectomy  . Breast cancer Paternal Aunt        diagnosed in her 84s  . Breast cancer Paternal Aunt        diagnosed in her 61s    Social History   Tobacco Use  . Smoking status: Former Smoker    Packs/day: 0.25    Quit date: 09/05/2014    Years since quitting: 5.7  . Smokeless tobacco: Never Used  Substance Use Topics  . Alcohol use: Yes    Alcohol/week: 7.0 standard drinks    Types: 7 Glasses of wine per week    Comment: occ  . Drug use: No    Home Medications Prior to Admission medications   Medication Sig Start Date End Date Taking? Authorizing Provider  ALPRAZolam Duanne Moron) 0.5 MG tablet Take 0.5  mg by mouth at bedtime as needed.     [provider]  Calcium Carb-Cholecalciferol (CALCIUM 600 + D PO) Take 1 tablet by mouth 2 (two) times daily.    [provider]  fluticasone Asencion Islam) 50 MCG/ACT nasal spray  04/05/17   [provider]  ibuprofen (ADVIL,MOTRIN) 200 MG tablet Take 200 mg by mouth every 6 (six) hours as needed.    [provider]  Probiotic Product (PROBIOTIC-10 PO) Take by mouth.    [provider]  Pseudoephedrine HCl (SUDAFED CONGESTION PO) Take 2 tablets by mouth 2 (two) times daily as needed.    [provider]  zolpidem (AMBIEN) 10 MG tablet Take 5 mg by mouth at bedtime as needed.     [provider]    Allergies    Patient has no known allergies.  Review of Systems   Review of Systems  All other systems  reviewed and are negative.   Physical Exam Updated Vital Signs BP 135/75 (BP Location: Right Arm)   Pulse 74   Temp 98.1 F (36.7 C) (Oral)   Resp 16   SpO2 98%   Physical Exam Vitals and nursing note reviewed.   74 year old female, resting comfortably and in no acute distress. Vital signs are normal. Oxygen saturation is 98%, which is normal. Head is normocephalic and atraumatic. PERRLA, EOMI. Oropharynx is clear. Neck is nontender and supple without adenopathy or JVD. Back is nontender and there is no CVA tenderness. Lungs are clear without rales, wheezes, or rhonchi. Chest is nontender. Heart has regular rate and rhythm without murmur. Abdomen is soft, flat, nontender without masses or hepatosplenomegaly and peristalsis is normoactive. Extremities: Left leg has a knee immobilizer in place.  On removing the immobilizer, there is a palpable gap in the left patella with tenderness over that area.  She is unable to keep her leg straight against gravity.  No other extremity injury is seen. Skin is warm and dry without rash. Neurologic: Mental status is normal, cranial nerves are intact, there are no motor or sensory deficits.  ED Results / Procedures / Treatments   Labs (all labs ordered are listed, but only abnormal results are displayed) Labs Reviewed  BASIC METABOLIC PANEL - Abnormal; Notable for the following components:      Result Value   Glucose, Bld 115 (*)    Calcium 8.4 (*)    All other components within normal limits  SARS CORONAVIRUS 2 BY RT PCR (HOSPITAL ORDER, [facility])  URINE CULTURE  CBC WITH DIFFERENTIAL/PLATELET  URINALYSIS, ROUTINE W REFLEX MICROSCOPIC    EKG EKG Interpretation  Date/Time:  Tuesday May 27 2020 04:06:34 EDT Ventricular Rate:  64 PR Interval:    QRS Duration: 95 QT Interval:  406 QTC Calculation: 419 R Axis:   1 Text Interpretation: Sinus rhythm Low voltage, precordial leads Otherwise within normal  limits No old tracing to compare Confirmed by Delora Fuel ([id number]) on 05/27/2020 4:19:34 AM   Radiology DG Chest 1 View  Result Date: 05/27/2020 CLINICAL DATA:  Fall, preoperative exam EXAM: CHEST  1 VIEW COMPARISON:  None. FINDINGS: Lung volumes are low with some coarse interstitial opacities towards the lung bases which could reflect atelectasis or mild edema given some peripheral septal lines and indistinct vascularity. No consolidative opacity, pneumothorax or effusion. The cardiomediastinal contours are unremarkable for portable technique. IMPRESSION: Low lung volumes with coarse interstitial opacities towards the lung bases which could reflect atelectasis or mild  edema. Electronically Signed   By: Lovena Le M.D.   On: 05/27/2020 04:00   DG Knee Complete 4 Views Left  Result Date: 05/27/2020 CLINICAL DATA:  Post fall with known patellar fracture. EXAM: LEFT KNEE - COMPLETE 4+ VIEW COMPARISON:  None. FINDINGS: Displaced transversely oriented fracture through the lower patellar pole has 16 mm osseous distraction. Mild superior migration of the proximal fracture fragment. No additional fracture of the knee. Normal tibiofemoral alignment. Trace joint effusion. IMPRESSION: Displaced patellar fracture with 16 mm osseous distraction. Electronically Signed   By: Keith Rake M.D.   On: 05/27/2020 04:00    Procedures Procedures  Medications Ordered in ED Medications  0.9 %  sodium chloride infusion (has no administration in time range)  ondansetron (ZOFRAN) injection 4 mg (has no administration in time range)  morphine 4 MG/ML injection 4 mg (has no administration in time range)  pantoprazole (PROTONIX) injection 40 mg (has no administration in time range)    ED Course  I have reviewed the triage vital signs and the nursing notes.  Pertinent labs & imaging results that were available during my care of the patient were reviewed by me and considered in my medical decision making (see chart  for details).  MDM Rules/Calculators/A&P Patella fracture.  Patient unable to manage at home pending elective surgical repair.  Will check screening labs and repeat knee x-ray since x-ray from [place] is not available to [place].  X-ray confirms displaced fracture of the left patella.  Labs are unremarkable and ECG is unremarkable.  Case is discussed with Dr. Alvan Dame of [facility] who agrees to admit the patient.  Final Clinical Impression(s) / ED Diagnoses Final diagnoses:  Closed displaced fracture of left patella, initial encounter    Rx / DC Orders ED Discharge Orders    None       Delora Fuel, MD 54/49/20 [phone]

## 2020-05-27 NOTE — ED Notes (Signed)
Sort stay came to tranport pt

## 2020-05-27 NOTE — Anesthesia Postprocedure Evaluation (Signed)
Anesthesia Post Note  Patient: Kristina Curry  Procedure(s) Performed: OPEN REDUCTION INTERNAL (ORIF) FIXATION LEFT PATELLA (Left Knee)     Patient location during evaluation: PACU Anesthesia Type: General Level of consciousness: awake and alert and oriented Pain management: pain level controlled Vital Signs Assessment: post-procedure vital signs reviewed and stable Respiratory status: spontaneous breathing, nonlabored ventilation and respiratory function stable Cardiovascular status: blood pressure returned to baseline and stable Postop Assessment: no apparent nausea or vomiting Anesthetic complications: no   No complications documented.  Last Vitals:  Vitals:   05/27/20 1445 05/27/20 1500  BP: 113/70 110/67  Pulse: 71 72  Resp: (!) 9 13  Temp:    SpO2: 100% 96%    Last Pain:  Vitals:   05/27/20 1500  TempSrc:   PainSc: Asleep                 Alexie Samson A.

## 2020-05-27 NOTE — Anesthesia Preprocedure Evaluation (Addendum)
Anesthesia Evaluation  Patient identified by MRN, date of birth, ID band Patient awake    Reviewed: Allergy & Precautions, NPO status , Patient's Chart, lab work & pertinent test results  Airway Mallampati: II  TM Distance: >3 FB Neck ROM: Full    Dental no notable dental hx. (+) Teeth Intact, Caps   Pulmonary former smoker,    Pulmonary exam normal breath sounds clear to auscultation       Cardiovascular negative cardio ROS Normal cardiovascular exam Rhythm:Regular Rate:Normal     Neuro/Psych  Headaches, Anxiety    GI/Hepatic Neg liver ROS, GERD  Medicated and Controlled,  Endo/Other  Hx/o breast Ca Obesity  Renal/GU negative Renal ROS Bladder dysfunction      Musculoskeletal  (+) Arthritis , Osteoarthritis,  Left Patella Fx Osteoporosis   Abdominal (+) + obese,   Peds  Hematology negative hematology ROS (+)   Anesthesia Other Findings   Reproductive/Obstetrics                            Anesthesia Physical Anesthesia Plan  ASA: II  Anesthesia Plan: General   Post-op Pain Management:  Regional for Post-op pain   Induction: Intravenous  PONV Risk Score and Plan: 4 or greater and Ondansetron and Treatment may vary due to age or medical condition  Airway Management Planned: LMA  Additional Equipment:   Intra-op Plan:   Post-operative Plan: Extubation in OR  Informed Consent: I have reviewed the patients History and Physical, chart, labs and discussed the procedure including the risks, benefits and alternatives for the proposed anesthesia with the patient or authorized representative who has indicated his/her understanding and acceptance.     Dental advisory given  Plan Discussed with: CRNA and Surgeon  Anesthesia Plan Comments:         Anesthesia Quick Evaluation

## 2020-05-27 NOTE — Discharge Instructions (Signed)
INSTRUCTIONS AFTER SURGERY  o Remove items at home which could result in a fall. This includes throw rugs or furniture in walking pathways o ICE to the affected joint every three hours while awake for 30 minutes at a time, for at least the first 3-5 days, and then as needed for pain and swelling.  Continue to use ice for pain and swelling. You may notice swelling that will progress down to the foot and ankle.  This is normal after surgery.  Elevate your leg when you are not up walking on it.   o Continue to use the breathing machine you got in the hospital (incentive spirometer) which will help keep your temperature down.  It is common for your temperature to cycle up and down following surgery, especially at night when you are not up moving around and exerting yourself.  The breathing machine keeps your lungs expanded and your temperature down.   DIET:  As you were doing prior to hospitalization, we recommend a well-balanced diet.  DRESSING / WOUND CARE / SHOWERING  Keep the surgical dressing until follow up.  The dressing is water proof, so you can shower without any extra covering.  IF THE DRESSING FALLS OFF or the wound gets wet inside, change the dressing with sterile gauze.  Please use good hand washing techniques before changing the dressing.  Do not use any lotions or creams on the incision until instructed by your surgeon.    ACTIVITY  o Increase activity slowly as tolerated, but follow the weight bearing instructions below.   o No driving for 6 weeks or until further direction given by your physician.  You cannot drive while taking narcotics.  o No lifting or carrying greater than 10 lbs. until further directed by your surgeon. o Avoid periods of inactivity such as sitting longer than an hour when not asleep. This helps prevent blood clots.  o You may return to work once you are authorized by your doctor.     WEIGHT BEARING   Weight bearing as tolerated with assist device (walker,  cane, etc) as directed, use it as long as suggested by your surgeon or therapist, typically at least 4-6 weeks. Do NOT bend the knee. Remain in knee immobilizer at all times.    CONSTIPATION  Constipation is defined medically as fewer than three stools per week and severe constipation as less than one stool per week.  Even if you have a regular bowel pattern at home, your normal regimen is likely to be disrupted due to multiple reasons following surgery.  Combination of anesthesia, postoperative narcotics, change in appetite and fluid intake all can affect your bowels.   YOU MUST use at least one of the following options; they are listed in order of increasing strength to get the job done.  They are all available over the counter, and you may need to use some, POSSIBLY even all of these options:    Drink plenty of fluids (prune juice may be helpful) and high fiber foods Colace 100 mg by mouth twice a day  Senokot for constipation as directed and as needed Dulcolax (bisacodyl), take with full glass of water  Miralax (polyethylene glycol) once or twice a day as needed.  If you have tried all these things and are unable to have a bowel movement in the first 3-4 days after surgery call either your surgeon or your primary doctor.    If you experience loose stools or diarrhea, hold the medications until you  stool forms back up.  If your symptoms do not get better within 1 week or if they get worse, check with your doctor.  If you experience "the worst abdominal pain ever" or develop nausea or vomiting, please contact the office immediately for further recommendations for treatment.   ITCHING:  If you experience itching with your medications, try taking only a single pain pill, or even half a pain pill at a time.  You can also use Benadryl over the counter for itching or also to help with sleep.    MEDICATIONS:  See your medication summary on the After Visit Summary that nursing will review with  you.  You may have some home medications which will be placed on hold until you complete the course of blood thinner medication.  It is important for you to complete the blood thinner medication as prescribed.  PRECAUTIONS:  If you experience chest pain or shortness of breath - call 911 immediately for transfer to the hospital emergency department.   If you develop a fever greater that 101 F, purulent drainage from wound, increased redness or drainage from wound, foul odor from the wound/dressing, or calf pain - CONTACT YOUR SURGEON.                                                   FOLLOW-UP APPOINTMENTS:  If you do not already have a post-op appointment, please call the office for an appointment to be seen by your surgeon.  Guidelines for how soon to be seen are listed in your After Visit Summary, but are typically between 1-4 weeks after surgery.  Thank you for letting us be a part of your medical care team.  It is a privilege we respect greatly.  We hope these instructions will help you stay on track for a fast and full recovery!

## 2020-05-27 NOTE — Transfer of Care (Signed)
Immediate Anesthesia Transfer of Care Note  Patient: Kristina Curry  Procedure(s) Performed: OPEN REDUCTION INTERNAL (ORIF) FIXATION LEFT PATELLA (Left Knee)  Patient Location: PACU  Anesthesia Type:General and Regional  Level of Consciousness: drowsy  Airway & Oxygen Therapy: Patient Spontanous Breathing and Patient connected to face mask oxygen  Post-op Assessment: Report given to RN and Post -op Vital signs reviewed and stable  Post vital signs: Reviewed and stable  Last Vitals:  Vitals Value Taken Time  BP 93/63 05/27/20 1420  Temp    Pulse 75 05/27/20 1424  Resp 7 05/27/20 1424  SpO2 97 % 05/27/20 1424  Vitals shown include unvalidated device data.  Last Pain:  Vitals:   05/27/20 1100  TempSrc:   PainSc: 1          Complications: No complications documented.

## 2020-05-27 NOTE — Brief Op Note (Signed)
05/27/2020  1:59 PM  PATIENT:  Kristina Curry  74 y.o. female  PRE-OPERATIVE DIAGNOSIS:  left fractured patella  POST-OPERATIVE DIAGNOSIS:  left inferior pole patella fracture  PROCEDURE:  Procedure(s): OPEN REDUCTION INTERNAL (ORIF) FIXATION LEFT PATELLA (Left) - see operative note for details  SURGEON:  Surgeon(s) and Role:    Paralee Cancel, MD - Primary  PHYSICIAN ASSISTANT: Griffith Citron, PA-C  ANESTHESIA:   regional and general  EBL:  25 mL   BLOOD ADMINISTERED:none  DRAINS: none   LOCAL MEDICATIONS USED:  NONE  SPECIMEN:  No Specimen  DISPOSITION OF SPECIMEN:  N/A  COUNTS:  YES  TOURNIQUET:   Total Tourniquet Time Documented: Thigh (Left) - 37 minutes Total: Thigh (Left) - 37 minutes   DICTATION: .Other Dictation: Dictation Number [id number]  PLAN OF CARE: Admit to inpatient   PATIENT DISPOSITION:  PACU - hemodynamically stable.   Delay start of Pharmacological VTE agent (>24hrs) due to surgical blood loss or risk of bleeding: no

## 2020-05-27 NOTE — H&P (Signed)
ADMISSION H&P  Patient is being admitted for open reduction internal fixation left patella fracture  Subjective:  Chief Complaint:left knee pain.  HPI: Kristina Curry, 74 y.o. female presented to the ED in [place], [place] after a fall on 05/26/20. She reports her right ankle gave way, which caused her to fall onto her knees. She had significant pain in the left knee, and was unable to stand. She denies other injury or LOC. Her husband was out running errands, and she was eventually able to get to the phone to call him. In the ED, imaging revealed displaced left patella fracture. She was put in a knee immobilizer and was scheduled for outpatient follow up at [facility]. Unfortunately, she was not able to tolerate this at home, and was sent to [facility] ED for surgical management. Dr. Alvan Dame was consulted for orthopaedic management.   Today, she reports that she lives at home with her husband in [place], [place]. She reports she ambulates without assistance at baseline. She is very active with golf and other activities. She is not on anticoagulants.   Patient Active Problem List   Diagnosis Date Noted   Arthralgia of hands, bilateral 02/07/2016   Past use of tobacco 02/07/2016   Osteopenia determined by x-ray 02/07/2016   High risk medication use 02/07/2016   Breast cancer screening, high risk patient 02/07/2016   Screening mammogram for high-risk patient 07/29/2015   Estrogen deficiency 07/29/2015   Former light tobacco smoker 07/29/2015   Continuous tobacco abuse 01/29/2015   Osteoporosis 07/16/2013   Malignant neoplasm of upper-outer quadrant of left breast in female, estrogen receptor positive ([place]) 07/21/2012   Past Medical History:  Diagnosis Date   Arthritis    Back pain    Breast cancer ([facility])    Dribbling of urine    Fatigue    GERD (gastroesophageal reflux disease)    Headache(784.0)    Osteoporosis, unspecified 07/16/2013   Ringing in ears    Sinus  complaint    Skin cancer     Past Surgical History:  Procedure Laterality Date   BREAST LUMPECTOMY  08/21/12   COLONOSCOPY     EYE SURGERY  1986   lt cataract   TUBAL LIGATION  1982    Current Facility-Administered Medications  Medication Dose Route Frequency Provider Last Rate Last Admin   0.9 %  sodium chloride infusion   Intravenous STAT Delora Fuel, MD 416 mL/hr at 05/27/20 0635 New Bag at 05/27/20 0635   morphine 4 MG/ML injection 4 mg  4 mg Intravenous L8G PRN Delora Fuel, MD   4 mg at 05/27/20 0640   ondansetron (ZOFRAN) injection 4 mg  4 mg Intravenous T3M PRN Delora Fuel, MD   4 mg at 05/27/20 4680   Current Outpatient Medications  Medication Sig Dispense Refill Last Dose   ALPRAZolam (XANAX) 1 MG tablet Take 1 mg by mouth at bedtime as needed for anxiety or sleep.   05/26/2020 at Unknown time   Calcium Carb-Cholecalciferol (CALCIUM 600 + D PO) Take 1 tablet by mouth 2 (two) times daily.   05/26/2020 at Unknown time   fluticasone (FLONASE) 50 MCG/ACT nasal spray Place 1 spray into both nostrils daily.    Past Week at Unknown time   ibuprofen (ADVIL,MOTRIN) 200 MG tablet Take 200 mg by mouth every 6 (six) hours as needed for moderate pain.    Past Month at Unknown time   pantoprazole (PROTONIX) 40 MG tablet Take 40 mg by mouth daily.  05/26/2020 at Unknown time   Pseudoephedrine HCl (SUDAFED CONGESTION PO) Take 2 tablets by mouth 2 (two) times daily as needed (congestion).    Past Week at Unknown time   zolpidem (AMBIEN) 5 MG tablet Take 5 mg by mouth at bedtime as needed for sleep.    Past Week at Unknown time   No Known Allergies  Social History   Tobacco Use   Smoking status: Former Smoker    Packs/day: 0.25    Quit date: 09/05/2014    Years since quitting: 5.7   Smokeless tobacco: Never Used  Substance Use Topics   Alcohol use: Yes    Alcohol/week: 7.0 standard drinks    Types: 7 Glasses of wine per week    Comment: occ    Family History   Problem Relation Age of Onset   Breast cancer Mother 77   Bone cancer Father 39   Breast cancer Maternal Aunt 41   Breast cancer Paternal Aunt 88       double mastectomy   Breast cancer Paternal Aunt        diagnosed in her 11s   Breast cancer Paternal Aunt        diagnosed in her 29s     Review of Systems  Constitutional: Negative for chills and fever.  Cardiovascular: Negative for chest pain.  Gastrointestinal: Negative for nausea and vomiting.  Musculoskeletal: Positive for arthralgias.    Objective:  Physical Exam   Left Knee Exam: Abrasion about the anterior knee. ROM not assessed. Sensation intact, distal pulses 2+.   Vital signs in last 24 hours: Temp:  [98.1 F (36.7 C)-98.7 F (37.1 C)] 98.7 F (37.1 C) (06/22 0604) Pulse Rate:  [63-90] 66 (06/22 1000) Resp:  [16-18] 18 (06/22 1000) BP: (129-135)/(75-85) 129/79 (06/22 1000) SpO2:  [96 %-98 %] 97 % (06/22 1000)  Labs:   Estimated body mass index is 31.53 kg/m as calculated from the following:   Height as of 10/24/19: 5\' 2"  (1.575 m).   Weight as of 10/24/19: 78.2 kg.   Imaging Review Plain radiographs demonstrate displaced patellar fracture with 16 mm osseous distraction.     Assessment/Plan:  Displaced patellar fracture, left knee   Plan: We will plan for open reduction internal fixation of the left patellar fracture. Plan for surgery this afternoon. This was discussed with Kristina Curry, who does wish to proceed. Dr. Alvan Dame discussed risks, benefits, and expectations.   Griffith Citron, PA-C Orthopedic Surgery [facility] [place] [phone]

## 2020-05-28 ENCOUNTER — Encounter (HOSPITAL_COMMUNITY): Payer: Self-pay | Admitting: Orthopedic Surgery

## 2020-05-28 DIAGNOSIS — E669 Obesity, unspecified: Secondary | ICD-10-CM | POA: Diagnosis present

## 2020-05-28 DIAGNOSIS — S82002A Unspecified fracture of left patella, initial encounter for closed fracture: Secondary | ICD-10-CM | POA: Diagnosis not present

## 2020-05-28 LAB — BASIC METABOLIC PANEL
Anion gap: 8 (ref 5–15)
BUN: 11 mg/dL (ref 8–23)
CO2: 24 mmol/L (ref 22–32)
Calcium: 7.8 mg/dL — ABNORMAL LOW (ref 8.9–10.3)
Chloride: 107 mmol/L (ref 98–111)
Creatinine, Ser: 0.7 mg/dL (ref 0.44–1.00)
GFR calc Af Amer: >60 mL/min (ref >60)
GFR calc non Af Amer: >60 mL/min (ref >60)
Glucose, Bld: 139 mg/dL — ABNORMAL HIGH (ref 70–99)
Potassium: 3.9 mmol/L (ref 3.5–5.1)
Sodium: 139 mmol/L (ref 135–145)

## 2020-05-28 LAB — CBC
HCT: 36.2 % (ref 36.0–46.0)
Hemoglobin: 11.3 g/dL — ABNORMAL LOW (ref 12.0–15.0)
MCH: 28.4 pg (ref 26.0–34.0)
MCHC: 31.2 g/dL (ref 30.0–36.0)
MCV: 91 fL (ref 80.0–100.0)
Platelets: 184 10*3/uL (ref 150–400)
RBC: 3.98 MIL/uL (ref 3.87–5.11)
RDW: 15.4 % (ref 11.5–15.5)
WBC: 8.7 10*3/uL (ref 4.0–10.5)
nRBC: 0 % (ref 0.0–0.2)

## 2020-05-28 LAB — URINE CULTURE: Culture: NO GROWTH

## 2020-05-28 MED ORDER — DOCUSATE SODIUM 100 MG PO CAPS
100.0000 mg | ORAL_CAPSULE | Freq: Two times a day (BID) | ORAL | 0 refills | Status: DC
Start: 2020-05-28 — End: 2022-09-15

## 2020-05-28 MED ORDER — HYDROCODONE-ACETAMINOPHEN 5-325 MG PO TABS: 1.0000 | ORAL_TABLET | Freq: Four times a day (QID) | ORAL | 0 refills | Status: DC | PRN

## 2020-05-28 MED ORDER — METHOCARBAMOL 500 MG PO TABS: 500.0000 mg | ORAL_TABLET | Freq: Four times a day (QID) | ORAL | 0 refills | Status: DC | PRN

## 2020-05-28 MED ORDER — ASPIRIN 81 MG PO CHEW
81.0000 mg | CHEWABLE_TABLET | Freq: Two times a day (BID) | ORAL | 0 refills | Status: AC
Start: 2020-05-29 — End: 2020-06-28

## 2020-05-28 MED ORDER — FERROUS SULFATE 325 (65 FE) MG PO TABS: 325.0000 mg | ORAL_TABLET | Freq: Three times a day (TID) | ORAL | 0 refills | Status: DC

## 2020-05-28 NOTE — Op Note (Signed)
NAMEJULIONA, VALES MEDICAL RECORD WN:[id number] ACCOUNT [id number] DATE OF BIRTH:06-26-1946 FACILITY: [facility] LOCATION: [facility] PHYSICIAN:Shemia Bevel Marian Sorrow, MD  OPERATIVE REPORT  DATE OF PROCEDURE:  05/27/2020  PREOPERATIVE DIAGNOSIS:  Left inferior pole patella fracture.  POSTOPERATIVE DIAGNOSIS:  Left inferior pole patella fracture.  PROCEDURE:  Open reduction internal fixation of left patella fracture utilizing a patella tendon repair construct utilizing two #2 FiberWire sutures passing through the inferior bone fragment and trying to reapproximate this segment to the remaining body  of the patella.  SURGEON:  Paralee Cancel, MD  ASSISTANT:  Griffith Citron, PA-C.  Note that Ms. Nehemiah Settle was present for the entirety of the case from preoperative positioning, perioperative management of the operative extremity, general facilitation of the case, and primary wound closure.  ANESTHESIA:  Preoperative regional femoral nerve block plus general LMA.  SPECIMENS:  None.  COMPLICATIONS:  None.  TOURNIQUET TIME:  37 minutes at 250 mmHg.  ESTIMATED BLOOD LOSS:  Minimal.  INDICATIONS:  The patient is a pleasant, healthy 74 year old female who unfortunately had a fall at her home onto concrete onto both her knees.  She had immediate onset of pain, worse on the left and the right.  She was initially seen at an outside  emergency room and discharged once the fracture was identified and a knee immobilizer with plans to follow up in our office based on her previous history.  She was unable to get around the house and be managed and so she transferred herself from her home  to the [facility] Emergency Room for [place] to address this.  She was seen and evaluated.  The injury was reviewed, physical exam findings discussed, radiographs were reviewed.  At this point, it was deemed that she needed to have her patella prepared.  We  discussed the pros and cons, the risks and benefits of getting this taken care  of.  Consent was obtained for benefit of pain relief.  Specific risks of infections, nonunion, loss of function of the extensor lag were discussed and reviewed.  DESCRIPTION OF PROCEDURE:  The patient was brought to the operative theater.  Once adequate anesthesia, preoperative antibiotics, Ancef administered, she was positioned supine.  A left thigh tourniquet was utilized.  The left lower extremity was  pre-scrubbed and cleaned due to an abrasion over the anterior aspect of the knee, then prepped and draped in sterile fashion.  A timeout was performed identifying the patient, the planned procedure and extremity.  The leg was exsanguinated, tourniquet  elevated to 250 mmHg.  Midline incision was made through her abraded skin.  Following soft tissue exposure, we did identify a hematoma within the joint itself.  Once evaluated and palpated a very small portion of the inferior pole of the patella, I  deemed that it was not going to be able to be repaired with screws.  Rather I decided to use FiberWire as if it was a patellar tendon rupture and treat it in that way.  We tried to incorporate the bone to have bone-to-bone healing as opposed to  bone-to-tendon healing, which typically is an improved process.  Given these findings, I further exposed the retinacular tears medially and laterally.  I then used two #2 FiberWire sutures that were passed through in a [place] weave pattern over the  medial and lateral aspect of the tendon.  There were now 4 strands coming out the proximal end of the tendon and bone.  I then used the 2.0 drill bit to create 3 holes  into the patella to pass the sutures through medially, 2 centrally and 1 lateral.   Given the fact there was some bone, I at this point, used a bone retinacular clamp and reapproximated the 2 bone ends.  With them reapproximated, I then reapproximated the medial sutures and then the lateral sutures and then the 2 together.  Once this  was done, there was no  palpable step-off at least on the ventral aspect of the patella.  Given this, I then used #1 Vicryl and reapproximated the medial and lateral retinacular tears.  I then used a #1 Vicryl to reapproximate the quadriceps tendon, which  was split to identify the sutures.  Once this was done, the tourniquet was let down after 37 minutes.  No significant hemostasis required.  The remainder of the wound was closed with 2-0 Vicryl and a running Monocryl stitch.  The knee was then cleaned,  dried and dressed sterilely with an Aquacel and a bulky sterile dressing and a knee immobilizer.  She will remain in a knee immobilizer while hospitalized.  We will transition her to T-ROM brace locked in extension.  She will be weightbearing as  tolerated.  Findings were reviewed with her husband.  CN/NUANCE  D:05/27/2020 T:05/28/2020 JOB:[id number]

## 2020-05-28 NOTE — Progress Notes (Signed)
     Subjective: 1 Day Post-Op Procedure(s) (LRB): OPEN REDUCTION INTERNAL (ORIF) FIXATION LEFT PATELLA (Left)   Patient reports pain as mild, pain controlled. No reported events throughout the night.  Dr. Alvan Dame discussed the procedure, findings and expectations moving forward.  Ready to be discharged home, when ready.  Follow up in the clinic in 2 weeks.  Knows to call with any questions or concerns.    Objective:   VITALS:   Vitals:   05/28/20 0143 05/28/20 0534  BP: 115/62 111/64  Pulse: (!) 58 (!) 46  Resp: 16 18  Temp: (!) 97.4 F (36.3 C) 97.7 F (36.5 C)  SpO2: 96% 98%    Dorsiflexion/Plantar flexion intact Incision: dressing C/D/I No cellulitis present Compartment soft  LABS Recent Labs    05/27/20 0510 05/28/20 0236  HGB 13.0 11.3*  HCT 39.9 36.2  WBC 9.7 8.7  PLT 226 184    Recent Labs    05/27/20 0510 05/28/20 0236  NA 138 139  K 3.5 3.9  BUN 18 11  CREATININE 0.71 0.70  GLUCOSE 115* 139*     Assessment/Plan: 1 Day Post-Op Procedure(s) (LRB): OPEN REDUCTION INTERNAL (ORIF) FIXATION LEFT PATELLA (Left)  Bledsoe brace ordered to be locked in extension at all times Advance diet Up with therapy D/C IV fluids Discharge home Follow up in 2 weeks at [facility] Follow up with OLIN,Pariss Hommes D in 2 weeks.  Contact information:  [facility] [address], [address] [phone]    Obese (BMI 30-39.9) Estimated body mass index is 30.11 kg/m as calculated from the following:   Height as of this encounter: 5\' 3"  (1.6 m).   Weight as of this encounter: 77.1 kg. Patient also counseled that weight may inhibit the healing process Patient counseled that losing weight will help with future health issues      Danae Orleans PA-C  [facility]  [place] [address], [place], [place] [address] Phone: [phone] [email or web address] Facebook  [employer]

## 2020-05-28 NOTE — Evaluation (Signed)
Physical Therapy Evaluation Patient Details Name: Kristina Curry MRN: [id number] DOB: [birth year] Today's Date: 05/28/2020   History of Present Illness  Pt is a 74 year old female s/p left patella ORIF  Clinical Impression  Patient is s/p above surgery resulting in functional limitations due to the deficits listed below (see PT Problem List).  Patient will benefit from skilled PT to increase their independence and safety with mobility to allow discharge to the venue listed below.  Pt requesting urgent assist to bathroom to void (maintained current KI).  Pt then ambulated short distance in hallway.  Pt awaiting bledsoe brace however reviewed no ROM to knee and maintaining brace at all times especially with mobilizing.  Pt reports spouse to arrive by the afternoon so will review precautions and practice steps with both pt and spouse later today prior to d/c.      Follow Up Recommendations Follow surgeons recommendation for DC plan and follow-up therapies    Equipment Recommendations  None recommended by PT    Recommendations for Other Services       Precautions / Restrictions Precautions Precautions: Fall;Knee Required Braces or Orthoses: Knee Immobilizer - Left Knee Immobilizer - Left: On at all times Restrictions LLE Weight Bearing: Weight bearing as tolerated Other Position/Activity Restrictions: with brace on at all times      Mobility  Bed Mobility Overal bed mobility: Needs Assistance Bed Mobility: Supine to Sit     Supine to sit: Min assist     General bed mobility comments: assist for L LE support; pt requesting to use bathroom urgently  Transfers Overall transfer level: Needs assistance Equipment used: Rolling walker (2 wheeled) Transfers: Sit to/from Stand Sit to Stand: Min guard         General transfer comment: verbal cues for UE and LE positioning  Ambulation/Gait Ambulation/Gait assistance: Min guard Gait Distance (Feet): 60 Feet Assistive device:  Rolling walker (2 wheeled) Gait Pattern/deviations: Step-to pattern;Decreased stance time - left;Antalgic     General Gait Details: verbal cues for RW positioning, step length, weight bearing more on UEs for pain control, posture  Stairs            Wheelchair Mobility    Modified Rankin (Stroke Patients Only)       Balance                                             Pertinent Vitals/Pain Pain Assessment: 0-10 Pain Score: 4  Pain Location: left knee Pain Descriptors / Indicators: Sore Pain Intervention(s): Limited activity within patient's tolerance;Monitored during session;Ice applied;Repositioned    Home Living Family/patient expects to be discharged to:: Private residence Living Arrangements: Spouse/significant other Available Help at Discharge: Family Type of Home: House Home Access: Stairs to enter Entrance Stairs-Rails: None Entrance Stairs-Number of Steps: 1+1 Home Layout: Able to live on main level with bedroom/bathroom Home Equipment: Walker - 2 wheels;Crutches;Bedside commode      Prior Function Level of Independence: Independent               Hand Dominance        Extremity/Trunk Assessment        Lower Extremity Assessment Lower Extremity Assessment: LLE deficits/detail LLE Deficits / Details: pt aware for no ROM to knee, brace on at all times       Communication   Communication: No difficulties  Cognition Arousal/Alertness:  Awake/alert Behavior During Therapy: WFL for tasks assessed/performed Overall Cognitive Status: Within Functional Limits for tasks assessed                                        General Comments      Exercises     Assessment/Plan    PT Assessment Patient needs continued PT services  PT Problem List Decreased strength;Decreased mobility;Decreased knowledge of use of DME;Pain;Decreased knowledge of precautions       PT Treatment Interventions DME instruction;Gait  training;Therapeutic exercise;Therapeutic activities;Patient/family education;Functional mobility training;Stair training;Balance training    PT Goals (Current goals can be found in the Care Plan section)  Acute Rehab PT Goals PT Goal Formulation: With patient Time For Goal Achievement: 06/04/20 Potential to Achieve Goals: Good    Frequency Min 5X/week   Barriers to discharge        Co-evaluation               AM-PAC PT "6 Clicks" Mobility  Outcome Measure Help needed turning from your back to your side while in a flat bed without using bedrails?: A Little Help needed moving from lying on your back to sitting on the side of a flat bed without using bedrails?: A Little Help needed moving to and from a bed to a chair (including a wheelchair)?: A Little Help needed standing up from a chair using your arms (e.g., wheelchair or bedside chair)?: A Little Help needed to walk in hospital room?: A Little Help needed climbing 3-5 steps with a railing? : A Little 6 Click Score: 18    End of Session Equipment Utilized During Treatment: Gait belt;Left knee immobilizer Activity Tolerance: Patient tolerated treatment well Patient left: in chair;with call bell/phone within reach Nurse Communication: Mobility status PT Visit Diagnosis: Other abnormalities of gait and mobility (R26.89)    Time: 1046-1100 PT Time Calculation (min) (ACUTE ONLY): 14 min   Charges:   PT Evaluation $PT Eval Low Complexity: 1 Low     Kati PT, DPT Acute Rehabilitation Services Pager: [phone] Office: [phone]  Trena Platt 05/28/2020, 12:24 PM

## 2020-05-28 NOTE — Progress Notes (Signed)
Orthopedic Tech Progress Note Patient Details:  Kristina Curry 1946/09/27 [id number]  Patient ID: Kristina Curry, female   DOB: [birth year], 74 y.o.   MRN: [id number]   Kristina Curry 05/28/2020, 10:21 AMCalled and Routed Bledsoe brace order to [employer].

## 2020-05-28 NOTE — Progress Notes (Signed)
Physical Therapy Treatment Patient Details Name: Kristina Curry MRN: [id number] DOB: 09/12/1946 Today's Date: 05/28/2020    History of Present Illness Pt is a 74 year old female s/p left patella ORIF    PT Comments    Pt ambulated again in hallway and practiced one step with spouse observing.  Pt and spouse aware of how to adjust brace and brace to be worn at all times.  Pt feels hesitant with d/c home today.  Pt currently min/guard assist and spouse able to assist at home initially.  Pt aware to take gait belt home as well.  Pt does prefer to stay another night, so discussed pt preference and performance with RN.    Follow Up Recommendations  Follow surgeon's recommendation for DC plan and follow-up therapies     Equipment Recommendations  None recommended by PT    Recommendations for Other Services       Precautions / Restrictions Precautions Precautions: Fall;Knee Precaution Comments: bledsoe brace locked in extension Required Braces or Orthoses: Other Brace Knee Immobilizer - Left: On at all times Restrictions LLE Weight Bearing: Weight bearing as tolerated Other Position/Activity Restrictions: with brace on at all times    Mobility  Bed Mobility Overal bed mobility: Needs Assistance Bed Mobility: Supine to Sit;Sit to Supine     Supine to sit: Supervision Sit to supine: Supervision   General bed mobility comments: pt self assisted with gait belt and brace  Transfers Overall transfer level: Needs assistance Equipment used: Rolling walker (2 wheeled) Transfers: Sit to/from Stand Sit to Stand: Min guard         General transfer comment: min/guard for safety however no assist required  Ambulation/Gait Ambulation/Gait assistance: Min guard Gait Distance (Feet): 80 Feet Assistive device: Rolling walker (2 wheeled) Gait Pattern/deviations: Step-to pattern;Decreased stance time - left;Antalgic     General Gait Details: verbal cues for RW positioning, step  length, posture   Stairs Stairs: Yes Stairs assistance: Min guard Stair Management: Step to pattern;Backwards;With walker Number of Stairs: 1 General stair comments: verbal cues for safe technique including sequence and RW positioning; pt reports understanding; spouse present and observed   Wheelchair Mobility    Modified Rankin (Stroke Patients Only)       Balance                                            Cognition Arousal/Alertness: Awake/alert Behavior During Therapy: WFL for tasks assessed/performed Overall Cognitive Status: Within Functional Limits for tasks assessed                                        Exercises      General Comments        Pertinent Vitals/Pain Pain Assessment: 0-10 Pain Score: 5  Pain Location: left knee Pain Descriptors / Indicators: Sore Pain Intervention(s): Monitored during session;Repositioned    Home Living Family/patient expects to be discharged to:: Private residence Living Arrangements: Spouse/significant other Available Help at Discharge: Family Type of Home: House Home Access: Stairs to enter Entrance Stairs-Rails: None Home Layout: Able to live on main level with bedroom/bathroom Home Equipment: Walker - 2 wheels;Crutches;Bedside commode      Prior Function Level of Independence: Independent          PT Goals (current goals  can now be found in the care plan section) Acute Rehab PT Goals PT Goal Formulation: With patient Time For Goal Achievement: 06/04/20 Potential to Achieve Goals: Good Progress towards PT goals: Progressing toward goals    Frequency    Min 5X/week      PT Plan Current plan remains appropriate    Co-evaluation              AM-PAC PT "6 Clicks" Mobility   Outcome Measure  Help needed turning from your back to your side while in a flat bed without using bedrails?: A Little Help needed moving from lying on your back to sitting on the side of a  flat bed without using bedrails?: A Little Help needed moving to and from a bed to a chair (including a wheelchair)?: A Little Help needed standing up from a chair using your arms (e.g., wheelchair or bedside chair)?: A Little Help needed to walk in hospital room?: A Little Help needed climbing 3-5 steps with a railing? : A Little 6 Click Score: 18    End of Session Equipment Utilized During Treatment: Gait belt Activity Tolerance: Patient tolerated treatment well Patient left: in bed;with call bell/phone within reach;with family/visitor present Nurse Communication: Mobility status PT Visit Diagnosis: Other abnormalities of gait and mobility (R26.89)     Time: 1414-1430 PT Time Calculation (min) (ACUTE ONLY): 16 min  Charges:  $Gait Training: 8-22 mins                     Arlyce Dice, DPT Acute Rehabilitation Services Pager: [phone] Office: [phone]  Trena Platt 05/28/2020, 2:48 PM

## 2020-05-29 DIAGNOSIS — S82002A Unspecified fracture of left patella, initial encounter for closed fracture: Secondary | ICD-10-CM | POA: Diagnosis not present

## 2020-05-29 LAB — BASIC METABOLIC PANEL
Anion gap: 10 (ref 5–15)
BUN: 19 mg/dL (ref 8–23)
CO2: 24 mmol/L (ref 22–32)
Calcium: 8.4 mg/dL — ABNORMAL LOW (ref 8.9–10.3)
Chloride: 107 mmol/L (ref 98–111)
Creatinine, Ser: 0.84 mg/dL (ref 0.44–1.00)
GFR calc Af Amer: >60 mL/min (ref >60)
GFR calc non Af Amer: >60 mL/min (ref >60)
Glucose, Bld: 102 mg/dL — ABNORMAL HIGH (ref 70–99)
Potassium: 3.7 mmol/L (ref 3.5–5.1)
Sodium: 141 mmol/L (ref 135–145)

## 2020-05-29 LAB — CBC
HCT: 37.2 % (ref 36.0–46.0)
Hemoglobin: 11.8 g/dL — ABNORMAL LOW (ref 12.0–15.0)
MCH: 28.8 pg (ref 26.0–34.0)
MCHC: 31.7 g/dL (ref 30.0–36.0)
MCV: 90.7 fL (ref 80.0–100.0)
Platelets: 206 10*3/uL (ref 150–400)
RBC: 4.1 MIL/uL (ref 3.87–5.11)
RDW: 15.2 % (ref 11.5–15.5)
WBC: 10.4 10*3/uL (ref 4.0–10.5)
nRBC: 0 % (ref 0.0–0.2)

## 2020-05-29 MED ORDER — CEPHALEXIN 500 MG PO CAPS: 500.0000 mg | ORAL_CAPSULE | Freq: Three times a day (TID) | ORAL | 0 refills | Status: AC

## 2020-05-29 NOTE — Progress Notes (Signed)
     Subjective: 2 Days Post-Op Procedure(s) (LRB): OPEN REDUCTION INTERNAL (ORIF) FIXATION LEFT PATELLA (Left)   Patient reports pain as mild, pain controlled. Slight increase of pain yesterday with movement, but better now. Feels that she worked well with PT, but looking forward to 1 session today to improve her anxiety about functioning at home. Discussed antibiotic due to her abrasion on the leg.  She will have one session of PT today and be ready to be discharged home. Follow up in the clinic in 2 weeks.  Knows to call with any questions or concerns.   Objective:   VITALS:   Vitals:   05/28/20 2129 05/29/20 0514  BP: 121/66 129/70  Pulse: 60 (!) 54  Resp: 16 20  Temp: 97.7 F (36.5 C) 98.1 F (36.7 C)  SpO2: 97% 95%   Bledsoe brace appears to be n proper position Dorsiflexion/Plantar flexion intact Incision: dressing C/D/I No cellulitis present Compartment soft  LABS Recent Labs    05/27/20 0510 05/28/20 0236 05/29/20 0233  HGB 13.0 11.3* 11.8*  HCT 39.9 36.2 37.2  WBC 9.7 8.7 10.4  PLT 226 184 206    Recent Labs    05/27/20 0510 05/28/20 0236 05/29/20 0233  NA 138 139 141  K 3.5 3.9 3.7  BUN 18 11 19   CREATININE 0.71 0.70 0.84  GLUCOSE 115* 139* 102*     Assessment/Plan: 2 Days Post-Op Procedure(s) (LRB): OPEN REDUCTION INTERNAL (ORIF) FIXATION LEFT PATELLA (Left) Bledsoe brace in proper position Up with therapy Discharge home Follow up in 2 weeks at [facility] Follow up with OLIN,Kedrick Mcnamee D in 2 weeks.  Contact information:  [facility] [address], [address] [place] [phone]             Danae Orleans PA-C  [facility]  [place] [address] [place], [place], [place] [address] Phone: [phone] [email or web address] Facebook  [employer]

## 2020-05-29 NOTE — Progress Notes (Signed)
Physical Therapy Treatment Patient Details Name: Kristina Curry MRN: [id number] DOB: [birth year] Today's Date: 05/29/2020    History of Present Illness Pt is a 74 year old female s/p left patella ORIF    PT Comments    Pt assisted with ambulating again and improved distance.  Also practiced one step again today.  Pt reports feeling ready for d/c home today as she has family coming to stay with her tonight. Pt had no further questions.   Follow Up Recommendations  Follow surgeon's recommendation for DC plan and follow-up therapies     Equipment Recommendations  None recommended by PT    Recommendations for Other Services       Precautions / Restrictions Precautions Precautions: Fall;Knee Precaution Comments: bledsoe brace locked in extension Required Braces or Orthoses: Other Brace Knee Immobilizer - Left: On at all times Restrictions Weight Bearing Restrictions: No LLE Weight Bearing: Weight bearing as tolerated Other Position/Activity Restrictions: with brace on at all times    Mobility  Bed Mobility Overal bed mobility: Needs Assistance Bed Mobility: Supine to Sit;Sit to Supine     Supine to sit: Supervision Sit to supine: Supervision   General bed mobility comments: pt self assisted with gait belt  Transfers Overall transfer level: Needs assistance Equipment used: Rolling walker (2 wheeled) Transfers: Sit to/from Stand Sit to Stand: Min guard;Supervision            Ambulation/Gait Ambulation/Gait assistance: Min Gaffer (Feet): 120 Feet Assistive device: Rolling walker (2 wheeled) Gait Pattern/deviations: Step-to pattern;Decreased stance time - left;Antalgic     General Gait Details: verbal cues for RW positioning, step length, posture   Stairs Stairs: Yes Stairs assistance: Min guard Stair Management: Step to pattern;Backwards;With walker Number of Stairs: 1 General stair comments: pt able to recall safe technique and  demonstrated well   Wheelchair Mobility    Modified Rankin (Stroke Patients Only)       Balance                                            Cognition Arousal/Alertness: Awake/alert Behavior During Therapy: WFL for tasks assessed/performed Overall Cognitive Status: Within Functional Limits for tasks assessed                                        Exercises      General Comments        Pertinent Vitals/Pain Pain Assessment: 0-10 Pain Score: 4  Pain Location: left knee Pain Descriptors / Indicators: Sore Pain Intervention(s): Repositioned;Monitored during session    Home Living                      Prior Function            PT Goals (current goals can now be found in the care plan section) Progress towards PT goals: Progressing toward goals    Frequency    Min 5X/week      PT Plan Current plan remains appropriate    Co-evaluation              AM-PAC PT "6 Clicks" Mobility   Outcome Measure  Help needed turning from your back to your side while in a flat bed without using bedrails?: A Little Help needed moving  from lying on your back to sitting on the side of a flat bed without using bedrails?: A Little Help needed moving to and from a bed to a chair (including a wheelchair)?: A Little Help needed standing up from a chair using your arms (e.g., wheelchair or bedside chair)?: A Little Help needed to walk in hospital room?: A Little Help needed climbing 3-5 steps with a railing? : A Little 6 Click Score: 18    End of Session Equipment Utilized During Treatment: Gait belt Activity Tolerance: Patient tolerated treatment well Patient left: in bed;with call bell/phone within reach Nurse Communication: Mobility status PT Visit Diagnosis: Other abnormalities of gait and mobility (R26.89)     Time: 9407-6808 PT Time Calculation (min) (ACUTE ONLY): 14 min  Charges:  $Gait Training: 8-22 mins                      Arlyce Dice, DPT Acute Rehabilitation Services Pager: [phone] Office: [place] 05/29/2020, 11:59 AM

## 2020-05-29 NOTE — Plan of Care (Signed)
All discharge instructions were given to Pt. All questions were answered. Discussed pain management. 

## 2020-06-03 NOTE — Discharge Summary (Signed)
Patient ID: Kristina Curry MRN: [id number] DOB/AGE: 1946/01/22 74 y.o.  Admit date: 05/27/2020 Discharge date: 06/03/2020  Admission Diagnoses:  Active Problems:   Closed displaced fracture of left patella   Obese   Discharge Diagnoses:  Same  Past Medical History:  Diagnosis Date  . Arthritis   . Back pain   . Breast cancer ([place])   . Dribbling of urine   . Fatigue   . GERD (gastroesophageal reflux disease)   . Headache(784.0)   . Osteoporosis, unspecified 07/16/2013  . Ringing in ears   . Sinus complaint   . Skin cancer     Surgeries: Procedure(s): OPEN REDUCTION INTERNAL (ORIF) FIXATION LEFT PATELLA on 05/27/2020   Consultants:   Discharged Condition: Improved  Hospital Course: Kristina Curry is an 74 y.o. female who was admitted 05/27/2020 for operative treatment of left patella fracture. Patient has severe unremitting pain that affects sleep, daily activities, and work/hobbies. After pre-op clearance the patient was taken to the operating room on 05/27/2020 and underwent  Procedure(s): OPEN REDUCTION INTERNAL (ORIF) FIXATION LEFT PATELLA.    Patient was given perioperative antibiotics:  Anti-infectives (From admission, onward)   Start     Dose/Rate Route Frequency Ordered Stop   05/29/20 0000  cephALEXin (KEFLEX) 500 MG capsule     Discontinue     500 mg Oral 3 times daily 05/29/20 0908 06/12/20 2359   05/27/20 1830  ceFAZolin (ANCEF) IVPB 2g/100 mL premix        2 g 200 mL/hr over 30 Minutes Intravenous Every 6 hours 05/27/20 1636 05/28/20 0210   05/27/20 1200  ceFAZolin (ANCEF) IVPB 2g/100 mL premix        2 g 200 mL/hr over 30 Minutes Intravenous On call to O.R. 05/27/20 1159 05/27/20 1310   05/27/20 1200  ceFAZolin (ANCEF) 3 g in dextrose 5 % 50 mL IVPB  Status:  Discontinued        3 g 100 mL/hr over 30 Minutes Intravenous On call to O.R. 05/27/20 1159 05/27/20 1203       Patient was given sequential compression devices, early ambulation, and  chemoprophylaxis to prevent DVT.  Patient benefited maximally from hospital stay and there were no complications.    Recent vital signs: No data found.   Recent laboratory studies: No results for input(s): WBC, HGB, HCT, PLT, NA, K, CL, CO2, BUN, CREATININE, GLUCOSE, INR, CALCIUM in the last 72 hours.  Invalid input(s): PT, 2   Discharge Medications:   Allergies as of 05/29/2020   No Known Allergies     Medication List    STOP taking these medications   ibuprofen 200 MG tablet Commonly known as: ADVIL     TAKE these medications   ALPRAZolam 1 MG tablet Commonly known as: XANAX Take 1 mg by mouth at bedtime as needed for anxiety or sleep.   aspirin 81 MG chewable tablet Commonly known as: Aspirin Childrens Chew 1 tablet (81 mg total) by mouth 2 (two) times daily. Take for 4 weeks, then resume regular dose.   CALCIUM 600 + D PO Take 1 tablet by mouth 2 (two) times daily.   cephALEXin 500 MG capsule Commonly known as: Keflex Take 1 capsule (500 mg total) by mouth 3 (three) times daily for 14 days.   docusate sodium 100 MG capsule Commonly known as: Colace Take 1 capsule (100 mg total) by mouth 2 (two) times daily.   ferrous sulfate 325 (65 FE) MG tablet Commonly known as: FerrouSul Take 1 tablet (  325 mg total) by mouth 3 (three) times daily with meals for 14 days.   fluticasone 50 MCG/ACT nasal spray Commonly known as: FLONASE Place 1 spray into both nostrils daily.   HYDROcodone-acetaminophen 5-325 MG tablet Commonly known as: Norco Take 1-2 tablets by mouth every 6 (six) hours as needed for moderate pain or severe pain.   methocarbamol 500 MG tablet Commonly known as: Robaxin Take 1 tablet (500 mg total) by mouth every 6 (six) hours as needed for muscle spasms.   pantoprazole 40 MG tablet Commonly known as: PROTONIX Take 40 mg by mouth daily.   SUDAFED CONGESTION PO Take 2 tablets by mouth 2 (two) times daily as needed (congestion).   zolpidem 5 MG  tablet Commonly known as: AMBIEN Take 5 mg by mouth at bedtime as needed for sleep.            Discharge Care Instructions  (From admission, onward)         Start     Ordered   05/28/20 0000  Change dressing       Comments: Maintain surgical dressing until follow up in the clinic. If the edges start to pull up, may reinforce with tape. If the dressing is no longer working, may remove and cover with gauze and tape, but must keep the area dry and clean.  Call with any questions or concerns.   05/28/20 0901          Diagnostic Studies: DG Chest 1 View  Result Date: 05/27/2020 CLINICAL DATA:  Fall, preoperative exam EXAM: CHEST  1 VIEW COMPARISON:  None. FINDINGS: Lung volumes are low with some coarse interstitial opacities towards the lung bases which could reflect atelectasis or mild edema given some peripheral septal lines and indistinct vascularity. No consolidative opacity, pneumothorax or effusion. The cardiomediastinal contours are unremarkable for portable technique. IMPRESSION: Low lung volumes with coarse interstitial opacities towards the lung bases which could reflect atelectasis or mild edema. Electronically Signed   By: Lovena Le M.D.   On: 05/27/2020 04:00   DG Knee Complete 4 Views Left  Result Date: 05/27/2020 CLINICAL DATA:  Post fall with known patellar fracture. EXAM: LEFT KNEE - COMPLETE 4+ VIEW COMPARISON:  None. FINDINGS: Displaced transversely oriented fracture through the lower patellar pole has 16 mm osseous distraction. Mild superior migration of the proximal fracture fragment. No additional fracture of the knee. Normal tibiofemoral alignment. Trace joint effusion. IMPRESSION: Displaced patellar fracture with 16 mm osseous distraction. Electronically Signed   By: Keith Rake M.D.   On: 05/27/2020 04:00    Disposition: Home  Discharge Instructions    Call MD / Call 911   Complete by: As directed    If you experience chest pain or shortness of breath,  CALL 911 and be transported to the hospital emergency room.  If you develope a fever above 101 F, pus (white drainage) or increased drainage or redness at the wound, or calf pain, call your surgeon's office.   Change dressing   Complete by: As directed    Maintain surgical dressing until follow up in the clinic. If the edges start to pull up, may reinforce with tape. If the dressing is no longer working, may remove and cover with gauze and tape, but must keep the area dry and clean.  Call with any questions or concerns.   Constipation Prevention   Complete by: As directed    Drink plenty of fluids.  Prune juice may be helpful.  You may  use a stool softener, such as Colace (over the counter) 100 mg twice a day.  Use MiraLax (over the counter) for constipation as needed.   Diet - low sodium heart healthy   Complete by: As directed    Discharge instructions   Complete by: As directed    Maintain surgical dressing until follow up in the clinic. If the edges start to pull up, may reinforce with tape. If the dressing is no longer working, may remove and cover with gauze and tape, but must keep the area dry and clean.  Follow up in 2 weeks at [facility]. Call with any questions or concerns.   Increase activity slowly as tolerated   Complete by: As directed    Weight bearing as tolerated with assist device (walker, cane, etc) as directed, use it as long as suggested by your surgeon or therapist, typically at least 4-6 weeks.  Brace must be in place and locked in extension.   TED hose   Complete by: As directed    Use stockings (TED hose) for 2 weeks on both leg(s).  You may remove them at night for sleeping.       Follow-up Information    Paralee Cancel, MD. Schedule an appointment as soon as possible for a visit in 2 weeks.   Specialty: Orthopedic Surgery Contact information: [address] [place] [address] [phone]                Signed: Lucille Passy  [facility] 06/03/2020, 10:45 AM

## 2020-06-12 ENCOUNTER — Telehealth: Payer: Self-pay | Admitting: Hematology

## 2020-06-12 NOTE — Telephone Encounter (Signed)
R/s appts to 11/29. Provider on pal. Pt is aware of appts.

## 2020-10-27 NOTE — Progress Notes (Signed)
Kristina Curry   [phone] [phone]   Clinic Follow up Note   Patient Care Team: [name], DO as PCP - General (Family Medicine) [name], DO as Consulting Physician (Family Medicine) Avon Gully, NP as Nurse Practitioner (Obstetrics and Gynecology) Thea Silversmith, MD as Consulting Physician (Radiation Oncology) Gatha Mayer, MD as Consulting Physician (Radiation Oncology) Marcy Panning, MD as Consulting Physician (Hematology and Oncology)  Date of Service:  11/03/2020  CHIEF COMPLAINT: F/u of left breast cancer   SUMMARY OF ONCOLOGIC HISTORY: Oncology History Overview Note  Cancer Staging Malignant neoplasm of upper-outer quadrant of left breast in female, estrogen receptor positive ([place]) Staging form: Breast, AJCC 7th Edition - Clinical: Stage IA (T1c, N0, cM0) - Unsigned Staging comments: Staged in breast conference 8.21.13  - Pathologic: No stage assigned - Unsigned    Malignant neoplasm of upper-outer quadrant of left breast in female, estrogen receptor positive ([place])  07/20/2012 Initial Biopsy   Biopsy found  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   07/21/2012 Initial Diagnosis   Malignant neoplasm of upper-outer quadrant of left breast in female, estrogen receptor positive ([place])   08/11/2012 Pathology Results   Final pathology showed  1.7 cm grade 1 with DCIS low to intermediate grade, margins negative and one sentinel node negative    - 11/17/2012 Radiation Therapy   Completed in [place] by Dr. Orlene Erm    Oncotype testing   Dx score 18 = risk of recurrence 11%.   08/11/2012 Cancer Staging   Staging form: Breast, AJCC 7th Edition - Pathologic stage from 08/11/2012: Stage IA (T1c, N0, cM0) - Signed by Truitt Merle, MD on 10/31/2020   11/2012 - 11/2017 Anti-estrogen oral therapy   Began Anastrozole 11/2012  Was held x2 weeks spring 2017, with full  resolution of arthralgias, then resumed   Completed in 11/2017.       CURRENT THERAPY:  Surveillance   INTERVAL HISTORY:  Kristina Curry is here for a follow up of left breast cancer. She was last seen by me 2 years ago and seen by NP Laice 1 year ago in interim. She presents to the clinic with her husband. She notes she is doing well. She notes having a fall in 05/26/20 when she twisted her ankle down the steps and fractured her left patella. She required surgery on 05/27/20 and has been recovering well. She only uses cane as needed. She had b/l PE in 06/2020 in [place], likely provoked. She completed 3 months of blood thinner and breathing adequately.     REVIEW OF SYSTEMS:   Constitutional: Denies fevers, chills or abnormal weight loss Eyes: Denies blurriness of vision Ears, nose, mouth, throat, and face: Denies mucositis or sore throat Respiratory: Denies cough, dyspnea or wheezes Cardiovascular: Denies palpitation, chest discomfort or lower extremity swelling Gastrointestinal:  Denies nausea, heartburn or change in bowel habits Skin: Denies abnormal skin rashes Lymphatics: Denies new lymphadenopathy or easy bruising Neurological:Denies numbness, tingling or new weaknesses Behavioral/Psych: Mood is stable, no new changes  All other systems were reviewed with the patient and are negative.  MEDICAL HISTORY:  Past Medical History:  Diagnosis Date  . Arthritis   . Back pain   . Breast cancer ([place])   . Dribbling of urine   . Fatigue   . GERD (gastroesophageal reflux disease)   . Headache(784.0)   . Osteoporosis, unspecified 07/16/2013  . Ringing in ears   .  Sinus complaint   . Skin cancer     SURGICAL HISTORY: Past Surgical History:  Procedure Laterality Date  . BREAST LUMPECTOMY  08/21/12  . COLONOSCOPY    . EYE SURGERY  1986   lt cataract  . ORIF PATELLA Left 05/27/2020   Procedure: OPEN REDUCTION INTERNAL (ORIF) FIXATION LEFT PATELLA;  Surgeon: Paralee Cancel, MD;   Location: [facility];  Service: Orthopedics;  Laterality: Left;  . TUBAL LIGATION  1982    I have reviewed the social history and family history with the patient and they are unchanged from previous note.  ALLERGIES:  has No Known Allergies.  MEDICATIONS:  Current Outpatient Medications  Medication Sig Dispense Refill  . ALPRAZolam (XANAX) 1 MG tablet Take 1 mg by mouth at bedtime as needed for anxiety or sleep.    . Calcium Carb-Cholecalciferol (CALCIUM 600 + D PO) Take 1 tablet by mouth 2 (two) times daily.    Marland Kitchen docusate sodium (COLACE) 100 MG capsule Take 1 capsule (100 mg total) by mouth 2 (two) times daily. 28 capsule 0  . ferrous sulfate (FERROUSUL) 325 (65 FE) MG tablet Take 1 tablet (325 mg total) by mouth 3 (three) times daily with meals for 14 days. 42 tablet 0  . fluticasone (FLONASE) 50 MCG/ACT nasal spray Place 1 spray into both nostrils daily.     Marland Kitchen HYDROcodone-acetaminophen (NORCO) 5-325 MG tablet Take 1-2 tablets by mouth every 6 (six) hours as needed for moderate pain or severe pain. 40 tablet 0  . methocarbamol (ROBAXIN) 500 MG tablet Take 1 tablet (500 mg total) by mouth every 6 (six) hours as needed for muscle spasms. 40 tablet 0  . pantoprazole (PROTONIX) 40 MG tablet Take 40 mg by mouth daily.    . Pseudoephedrine HCl (SUDAFED CONGESTION PO) Take 2 tablets by mouth 2 (two) times daily as needed (congestion).     Marland Kitchen zolpidem (AMBIEN) 5 MG tablet Take 5 mg by mouth at bedtime as needed for sleep.      No current facility-administered medications for this visit.    PHYSICAL EXAMINATION: ECOG PERFORMANCE STATUS: 1  Vitals:   11/03/20 1252  BP: 108/68  Pulse: 67  Resp: 14  Temp: 98 F (36.7 C)  SpO2: 95%   Filed Weights   11/03/20 1252  Weight: 185 lb 14.4 oz (84.3 kg)    GENERAL:alert, no distress and comfortable SKIN: skin color, texture, turgor are normal, no rashes or significant lesions (+) Multiple skin lesions  EYES: normal, Conjunctiva are pink and  non-injected, sclera clear  NECK: supple, thyroid normal size, non-tender, without nodularity LYMPH:  no palpable lymphadenopathy in the cervical, axillary  LUNGS: clear to auscultation and percussion with normal breathing effort HEART: regular rate & rhythm and no murmurs and no lower extremity edema ABDOMEN:abdomen soft, non-tender and normal bowel sounds Musculoskeletal:no cyanosis of digits and no clubbing (+) Scoliosis of spine NEURO: alert & oriented x 3 with fluent speech, no focal motor/sensory deficits BREAST: S/p left lumpectomy: Surgical incision healed well. No palpable mass, nodules or adenopathy bilaterally. Breast exam benign.   LABORATORY DATA:  I have reviewed the data as listed CBC Latest Ref Rng & Units 11/03/2020 05/29/2020 05/28/2020  WBC 4.0 - 10.5 K/uL 6.0 10.4 8.7  Hemoglobin 12.0 - 15.0 g/dL 13.9 11.8(L) 11.3(L)  Hematocrit 36 - 46 % 43.8 37.2 36.2  Platelets 150 - 400 K/uL 306 206 184     CMP Latest Ref Rng & Units 11/03/2020 05/29/2020 05/28/2020  Glucose  70 - 99 mg/dL 90 102(H) 139(H)  BUN 8 - 23 mg/dL _0 Creatinine 0.44 - 1.00 mg/dL 0.75 0.84 0.70  Sodium 135 - 145 mmol/L 140 141 139  Potassium 3.5 - 5.1 mmol/L 3.8 3.7 3.9  Chloride 98 - 111 mmol/L 107 107 107  CO2 22 - 32 mmol/L _1 Calcium 8.9 - 10.3 mg/dL 9.4 8.4(L) 7.8(L)  Total Protein 6.5 - 8.1 g/dL 7.5 - -  Total Bilirubin 0.3 - 1.2 mg/dL 0.4 - -  Alkaline Phos 38 - 126 U/L 59 - -  AST 15 - 41 U/L 14(L) - -  ALT 0 - 44 U/L 14 - -      RADIOGRAPHIC STUDIES: I have personally reviewed the radiological images as listed and agreed with the findings in the report. No results found.   ASSESSMENT & PLAN:  Kristina Curry is a 74 y.o. female with   1. Malignant neoplasm of upper-outer quadrant of left breast, invasive ductal carcinoma, G1, T1N0 stage IA, ER+/ PR + HER2- -She was diagnosed 07/2012, treated with left lumpectomy and one sentinel node in 08/2012, local radiation and 5 years  of Anastrozole 11/2012-11/2017. -From a breast cancer standpoint, she is clinically doing well. Lab reviewed, her CBC and CMP are within normal limits. Her physical exam and her 11/2019 mammogram were unremarkable. There is no clinical concern for recurrence. -I discussed she does not require more than 5 years antiestrogen therapy given her stage I disease. She will continue surveillance with yearly mammogram and breast exams. Next mammogram in 11/2020. If she proceeds with Zometa, I will continue to f/u with her yearly.     2. Osteopenia and fracture after fall  -Bone density scan was done on 10/18/2016, with T score -1.9 in the right femur neck, osteopenia. Her 11/2018 DEXA showed mild improved with lowest T-score -1.6. She is due to repeat in 11/2020.  -Continue calcium and Vitamin D. She is no longer on AI. I encouraged her to work on increasing activity level again.  -Given recent fall and fracture I discussed bisphosphonate with Zometa  q21month for 2 years which also reduces her future risk of bone mets from breast cancer. I reviewed side effects with her including the small risk of jaw necrosis. I gave her print out, she will think about it and discuss with her dentist.    3. 05/2020 Left patellar Fracture, 06/2020 B/l PE  -After a fall down the steps in 05/2020 which resulted in left patellar fracture. She required surgery on 05/27/20. She has recovered well and ambulates with cane as needed.  -She had b/l PE in 06/2020, likely provoked from surgery and decreased mobility. She completed 3 months of blood thinner.   4. Chronic back pain, Tolerable and stable.    5. H/o Basal skin cancer. [facility]u   PLAN -Mammogram and DEXA in 11/2020  -Lab and F/u in 1 year.  -she will call back if she decides to proceed with zometa infusion every 6 months, will schedule for her    No problem-specific Assessment & Plan notes found for this  encounter.   No orders of the defined types were placed in this encounter.  All questions were answered. The patient knows to call the clinic with any problems, questions or concerns. No barriers to learning was detected. The total time spent in the appointment was 30 minutes.     YTruitt Merle MD 11/03/2020  Oneal Deputy, am acting as scribe for Truitt Merle, MD.   I have reviewed the above documentation for accuracy and completeness, and I agree with the above.

## 2020-10-29 ENCOUNTER — Other Ambulatory Visit: Payer: Medicare Other

## 2020-10-29 ENCOUNTER — Ambulatory Visit: Payer: Medicare Other | Admitting: Hematology

## 2020-11-03 ENCOUNTER — Encounter: Payer: Self-pay | Admitting: Hematology

## 2020-11-03 ENCOUNTER — Inpatient Hospital Stay: Payer: Medicare Other | Attending: Hematology

## 2020-11-03 ENCOUNTER — Other Ambulatory Visit: Payer: Self-pay

## 2020-11-03 ENCOUNTER — Inpatient Hospital Stay (HOSPITAL_BASED_OUTPATIENT_CLINIC_OR_DEPARTMENT_OTHER): Payer: Medicare Other | Admitting: Hematology

## 2020-11-03 VITALS — BP 108/68 | HR 67 | Temp 98.0°F | Resp 14 | Ht 63.0 in | Wt 185.9 lb

## 2020-11-03 DIAGNOSIS — M8580 Other specified disorders of bone density and structure, unspecified site: Secondary | ICD-10-CM | POA: Insufficient documentation

## 2020-11-03 DIAGNOSIS — Z170 Estrogen receptor positive status [ER+]: Secondary | ICD-10-CM | POA: Diagnosis not present

## 2020-11-03 DIAGNOSIS — Z79899 Other long term (current) drug therapy: Secondary | ICD-10-CM | POA: Insufficient documentation

## 2020-11-03 DIAGNOSIS — M810 Age-related osteoporosis without current pathological fracture: Secondary | ICD-10-CM | POA: Diagnosis not present

## 2020-11-03 DIAGNOSIS — C50412 Malignant neoplasm of upper-outer quadrant of left female breast: Secondary | ICD-10-CM

## 2020-11-03 DIAGNOSIS — G8929 Other chronic pain: Secondary | ICD-10-CM | POA: Diagnosis not present

## 2020-11-03 DIAGNOSIS — M549 Dorsalgia, unspecified: Secondary | ICD-10-CM | POA: Diagnosis not present

## 2020-11-03 DIAGNOSIS — Z85828 Personal history of other malignant neoplasm of skin: Secondary | ICD-10-CM | POA: Insufficient documentation

## 2020-11-03 LAB — CBC WITH DIFFERENTIAL/PLATELET
Abs Immature Granulocytes: 0.02 10*3/uL (ref 0.00–0.07)
Basophils Absolute: 0.1 10*3/uL (ref 0.0–0.1)
Basophils Relative: 1 %
Eosinophils Absolute: 0.1 10*3/uL (ref 0.0–0.5)
Eosinophils Relative: 2 %
HCT: 43.8 % (ref 36.0–46.0)
Hemoglobin: 13.9 g/dL (ref 12.0–15.0)
Immature Granulocytes: 0 %
Lymphocytes Relative: 25 %
Lymphs Abs: 1.5 10*3/uL (ref 0.7–4.0)
MCH: 28.9 pg (ref 26.0–34.0)
MCHC: 31.7 g/dL (ref 30.0–36.0)
MCV: 91.1 fL (ref 80.0–100.0)
Monocytes Absolute: 0.7 10*3/uL (ref 0.1–1.0)
Monocytes Relative: 11 %
Neutro Abs: 3.6 10*3/uL (ref 1.7–7.7)
Neutrophils Relative %: 61 %
Platelets: 306 10*3/uL (ref 150–400)
RBC: 4.81 MIL/uL (ref 3.87–5.11)
RDW: 14.7 % (ref 11.5–15.5)
WBC: 6 10*3/uL (ref 4.0–10.5)
nRBC: 0 % (ref 0.0–0.2)

## 2020-11-03 LAB — COMPREHENSIVE METABOLIC PANEL
ALT: 14 U/L (ref 0–44)
AST: 14 U/L — ABNORMAL LOW (ref 15–41)
Albumin: 3.8 g/dL (ref 3.5–5.0)
Alkaline Phosphatase: 59 U/L (ref 38–126)
Anion gap: 8 (ref 5–15)
BUN: 18 mg/dL (ref 8–23)
CO2: 25 mmol/L (ref 22–32)
Calcium: 9.4 mg/dL (ref 8.9–10.3)
Chloride: 107 mmol/L (ref 98–111)
Creatinine, Ser: 0.75 mg/dL (ref 0.44–1.00)
GFR, Estimated: >60 mL/min (ref >60)
Glucose, Bld: 90 mg/dL (ref 70–99)
Potassium: 3.8 mmol/L (ref 3.5–5.1)
Sodium: 140 mmol/L (ref 135–145)
Total Bilirubin: 0.4 mg/dL (ref 0.3–1.2)
Total Protein: 7.5 g/dL (ref 6.5–8.1)

## 2020-11-03 LAB — VITAMIN D 25 HYDROXY (VIT D DEFICIENCY, FRACTURES): Vit D, 25-Hydroxy: 26.34 ng/mL — ABNORMAL LOW (ref 30–100)

## 2020-11-04 ENCOUNTER — Telehealth: Payer: Self-pay | Admitting: Hematology

## 2020-11-04 NOTE — Telephone Encounter (Signed)
Scheduled appts per 11/29 los. Left voicemail with appt date and time.

## 2020-11-17 ENCOUNTER — Telehealth: Payer: Self-pay

## 2020-11-17 NOTE — Telephone Encounter (Signed)
Pt made aware of VIT D results and recommendations

## 2020-11-17 NOTE — Telephone Encounter (Signed)
-----   Message from Truitt Merle, MD sent at 11/16/2020 11:40 PM EST ----- Please let pt know her vitd level is slightly low, please double her OTC vitD supplement thanks  Truitt Merle  11/16/2020

## 2021-02-13 ENCOUNTER — Encounter: Payer: Self-pay | Admitting: Hematology

## 2021-02-13 ENCOUNTER — Telehealth: Payer: Self-pay | Admitting: Hematology

## 2021-02-13 NOTE — Telephone Encounter (Signed)
Scheduled appointments per 3/10 sch msg. Pt aware.

## 2021-02-25 ENCOUNTER — Encounter: Payer: Self-pay | Admitting: Hematology

## 2021-03-13 ENCOUNTER — Other Ambulatory Visit: Payer: Self-pay

## 2021-03-13 ENCOUNTER — Inpatient Hospital Stay: Payer: Medicare Other

## 2021-03-13 ENCOUNTER — Inpatient Hospital Stay: Payer: Medicare Other | Attending: Hematology

## 2021-03-13 VITALS — BP 119/70 | HR 53 | Temp 98.0°F | Resp 18

## 2021-03-13 DIAGNOSIS — C50412 Malignant neoplasm of upper-outer quadrant of left female breast: Secondary | ICD-10-CM

## 2021-03-13 DIAGNOSIS — Z170 Estrogen receptor positive status [ER+]: Secondary | ICD-10-CM | POA: Insufficient documentation

## 2021-03-13 DIAGNOSIS — M810 Age-related osteoporosis without current pathological fracture: Secondary | ICD-10-CM | POA: Diagnosis present

## 2021-03-13 LAB — CBC WITH DIFFERENTIAL/PLATELET
Abs Immature Granulocytes: 0.02 10*3/uL (ref 0.00–0.07)
Basophils Absolute: 0.1 10*3/uL (ref 0.0–0.1)
Basophils Relative: 1 %
Eosinophils Absolute: 0.2 10*3/uL (ref 0.0–0.5)
Eosinophils Relative: 3 %
HCT: 43 % (ref 36.0–46.0)
Hemoglobin: 13.8 g/dL (ref 12.0–15.0)
Immature Granulocytes: 0 %
Lymphocytes Relative: 26 %
Lymphs Abs: 1.6 10*3/uL (ref 0.7–4.0)
MCH: 28.8 pg (ref 26.0–34.0)
MCHC: 32.1 g/dL (ref 30.0–36.0)
MCV: 89.8 fL (ref 80.0–100.0)
Monocytes Absolute: 0.7 10*3/uL (ref 0.1–1.0)
Monocytes Relative: 12 %
Neutro Abs: 3.5 10*3/uL (ref 1.7–7.7)
Neutrophils Relative %: 58 %
Platelets: 255 10*3/uL (ref 150–400)
RBC: 4.79 MIL/uL (ref 3.87–5.11)
RDW: 14.6 % (ref 11.5–15.5)
WBC: 5.9 10*3/uL (ref 4.0–10.5)
nRBC: 0 % (ref 0.0–0.2)

## 2021-03-13 LAB — COMPREHENSIVE METABOLIC PANEL
ALT: 13 U/L (ref 0–44)
AST: 15 U/L (ref 15–41)
Albumin: 3.9 g/dL (ref 3.5–5.0)
Alkaline Phosphatase: 57 U/L (ref 38–126)
Anion gap: 12 (ref 5–15)
BUN: 18 mg/dL (ref 8–23)
CO2: 22 mmol/L (ref 22–32)
Calcium: 8.8 mg/dL — ABNORMAL LOW (ref 8.9–10.3)
Chloride: 107 mmol/L (ref 98–111)
Creatinine, Ser: 0.79 mg/dL (ref 0.44–1.00)
GFR, Estimated: >60 mL/min (ref >60)
Glucose, Bld: 88 mg/dL (ref 70–99)
Potassium: 3.8 mmol/L (ref 3.5–5.1)
Sodium: 141 mmol/L (ref 135–145)
Total Bilirubin: 0.4 mg/dL (ref 0.3–1.2)
Total Protein: 7.5 g/dL (ref 6.5–8.1)

## 2021-03-13 LAB — VITAMIN D 25 HYDROXY (VIT D DEFICIENCY, FRACTURES): Vit D, 25-Hydroxy: 39.54 ng/mL (ref 30–100)

## 2021-03-13 MED ORDER — ZOLEDRONIC ACID 4 MG/100ML IV SOLN
INTRAVENOUS | Status: AC
  Filled 2021-03-13: qty 100

## 2021-03-13 MED ORDER — SODIUM CHLORIDE 0.9 % IV SOLN
Freq: Once | INTRAVENOUS | Status: AC
  Filled 2021-03-13: qty 250

## 2021-03-13 MED ORDER — ZOLEDRONIC ACID 4 MG/100ML IV SOLN
4.0000 mg | Freq: Once | INTRAVENOUS | Status: AC
  Administered 2021-03-13: 4 mg via INTRAVENOUS

## 2021-03-13 NOTE — Patient Instructions (Signed)
Zoledronic Acid Injection (Hypercalcemia, Oncology) What is this medicine? ZOLEDRONIC ACID (ZOE le dron ik AS id) slows calcium loss from bones. It high calcium levels in the blood from some kinds of cancer. It may be used in other people at risk for bone loss. This medicine may be used for other purposes; ask your health care provider or pharmacist if you have questions. COMMON BRAND NAME(S): Zometa What should I tell my health care provider before I take this medicine? They need to know if you have any of these conditions:  cancer  dehydration  dental disease  kidney disease  liver disease  low levels of calcium in the blood  lung or breathing disease (asthma)  receiving steroids like dexamethasone or prednisone  an unusual or allergic reaction to zoledronic acid, other medicines, foods, dyes, or preservatives  pregnant or trying to get pregnant  breast-feeding How should I use this medicine? This drug is injected into a vein. It is given by a health care provider in a hospital or clinic setting. Talk to your health care provider about the use of this drug in children. Special care may be needed. Overdosage: If you think you have taken too much of this medicine contact a poison control center or emergency room at once. NOTE: This medicine is only for you. Do not share this medicine with others. What if I miss a dose? Keep appointments for follow-up doses. It is important not to miss your dose. Call your health care provider if you are unable to keep an appointment. What may interact with this medicine?  certain antibiotics given by injection  NSAIDs, medicines for pain and inflammation, like ibuprofen or naproxen  some diuretics like bumetanide, furosemide  teriparatide  thalidomide This list may not describe all possible interactions. Give your health care provider a list of all the medicines, herbs, non-prescription drugs, or dietary supplements you use. Also tell  them if you smoke, drink alcohol, or use illegal drugs. Some items may interact with your medicine. What should I watch for while using this medicine? Visit your health care provider for regular checks on your progress. It may be some time before you see the benefit from this drug. Some people who take this drug have severe bone, joint, or muscle pain. This drug may also increase your risk for jaw problems or a broken thigh bone. Tell your health care provider right away if you have severe pain in your jaw, bones, joints, or muscles. Tell you health care provider if you have any pain that does not go away or that gets worse. Tell your dentist and dental surgeon that you are taking this drug. You should not have major dental surgery while on this drug. See your dentist to have a dental exam and fix any dental problems before starting this drug. Take good care of your teeth while on this drug. Make sure you see your dentist for regular follow-up appointments. You should make sure you get enough calcium and vitamin D while you are taking this drug. Discuss the foods you eat and the vitamins you take with your health care provider. Check with your health care provider if you have severe diarrhea, nausea, and vomiting, or if you sweat a lot. The loss of too much body fluid may make it dangerous for you to take this drug. You may need blood work done while you are taking this drug. Do not become pregnant while taking this drug. Women should inform their health care provider   if they wish to become pregnant or think they might be pregnant. There is potential for serious harm to an unborn child. Talk to your health care provider for more information. What side effects may I notice from receiving this medicine? Side effects that you should report to your doctor or health care provider as soon as possible:  allergic reactions (skin rash, itching or hives; swelling of the face, lips, or tongue)  bone  pain  infection (fever, chills, cough, sore throat, pain or trouble passing urine)  jaw pain, especially after dental work  joint pain  kidney injury (trouble passing urine or change in the amount of urine)  low blood pressure (dizziness; feeling faint or lightheaded, falls; unusually weak or tired)  low calcium levels (fast heartbeat; muscle cramps or pain; pain, tingling, or numbness in the hands or feet; seizures)  low magnesium levels (fast, irregular heartbeat; muscle cramp or pain; muscle weakness; tremors; seizures)  low red blood cell counts (trouble breathing; feeling faint; lightheaded, falls; unusually weak or tired)  muscle pain  redness, blistering, peeling, or loosening of the skin, including inside the mouth  severe diarrhea  swelling of the ankles, feet, hands  trouble breathing Side effects that usually do not require medical attention (report to your doctor or health care provider if they continue or are bothersome):  anxious  constipation  coughing  depressed mood  eye irritation, itching, or pain  fever  general ill feeling or flu-like symptoms  nausea  pain, redness, or irritation at site where injected  trouble sleeping This list may not describe all possible side effects. Call your doctor for medical advice about side effects. You may report side effects to FDA at 1-800-FDA-1088. Where should I keep my medicine? This drug is given in a hospital or clinic. It will not be stored at home. NOTE: This sheet is a summary. It may not cover all possible information. If you have questions about this medicine, talk to your doctor, pharmacist, or health care provider.  2021 Elsevier/Gold Standard (2019-09-06 09:13:00)  

## 2021-04-06 ENCOUNTER — Telehealth: Payer: Self-pay

## 2021-04-06 NOTE — Telephone Encounter (Signed)
-----   Message from Truitt Merle, MD sent at 04/05/2021 10:47 PM EDT ----- Please let pt know her lab results. VitD normal, calcium level slightly low, she had zometa 3 weeks ago, please make sure she is taking calcium and vitD at least 600mg /1000u, and double her dose if she was already on when she had lab 3 weeks ago. Thanks   Truitt Merle

## 2021-04-06 NOTE — Telephone Encounter (Signed)
Called made pt aware of lab results pt has added a second dose of calcium taking 1 in the morning and 1 at night we reviewed up coming appt and she was encouraged to call for any questions changes or concerns

## 2021-05-01 ENCOUNTER — Other Ambulatory Visit: Payer: Self-pay

## 2021-05-01 DIAGNOSIS — Z170 Estrogen receptor positive status [ER+]: Secondary | ICD-10-CM

## 2021-05-05 ENCOUNTER — Telehealth: Payer: Self-pay | Admitting: Hematology

## 2021-05-05 NOTE — Telephone Encounter (Signed)
R/s appts per 5/31 sch msg. Pt aware.  

## 2021-05-06 ENCOUNTER — Ambulatory Visit: Payer: Medicare Other | Admitting: Hematology

## 2021-05-06 ENCOUNTER — Other Ambulatory Visit: Payer: Medicare Other

## 2021-05-28 ENCOUNTER — Inpatient Hospital Stay (HOSPITAL_BASED_OUTPATIENT_CLINIC_OR_DEPARTMENT_OTHER): Payer: Medicare Other | Admitting: Hematology

## 2021-05-28 ENCOUNTER — Encounter: Payer: Self-pay | Admitting: Hematology

## 2021-05-28 ENCOUNTER — Inpatient Hospital Stay: Payer: Medicare Other | Attending: Hematology

## 2021-05-28 ENCOUNTER — Other Ambulatory Visit: Payer: Self-pay

## 2021-05-28 VITALS — BP 122/75 | HR 71 | Temp 97.2°F | Resp 18 | Ht 63.0 in | Wt 183.7 lb

## 2021-05-28 DIAGNOSIS — M85851 Other specified disorders of bone density and structure, right thigh: Secondary | ICD-10-CM | POA: Diagnosis not present

## 2021-05-28 DIAGNOSIS — Z85828 Personal history of other malignant neoplasm of skin: Secondary | ICD-10-CM | POA: Diagnosis not present

## 2021-05-28 DIAGNOSIS — C50412 Malignant neoplasm of upper-outer quadrant of left female breast: Secondary | ICD-10-CM

## 2021-05-28 DIAGNOSIS — Z79899 Other long term (current) drug therapy: Secondary | ICD-10-CM | POA: Insufficient documentation

## 2021-05-28 DIAGNOSIS — M549 Dorsalgia, unspecified: Secondary | ICD-10-CM | POA: Insufficient documentation

## 2021-05-28 DIAGNOSIS — Z170 Estrogen receptor positive status [ER+]: Secondary | ICD-10-CM

## 2021-05-28 DIAGNOSIS — G8929 Other chronic pain: Secondary | ICD-10-CM | POA: Insufficient documentation

## 2021-05-28 LAB — CMP (CANCER CENTER ONLY)
ALT: 13 U/L (ref 0–44)
AST: 16 U/L (ref 15–41)
Albumin: 3.7 g/dL (ref 3.5–5.0)
Alkaline Phosphatase: 56 U/L (ref 38–126)
Anion gap: 9 (ref 5–15)
BUN: 23 mg/dL (ref 8–23)
CO2: 25 mmol/L (ref 22–32)
Calcium: 9.5 mg/dL (ref 8.9–10.3)
Chloride: 105 mmol/L (ref 98–111)
Creatinine: 0.85 mg/dL (ref 0.44–1.00)
GFR, Estimated: >60 mL/min (ref >60)
Glucose, Bld: 91 mg/dL (ref 70–99)
Potassium: 4 mmol/L (ref 3.5–5.1)
Sodium: 139 mmol/L (ref 135–145)
Total Bilirubin: 0.4 mg/dL (ref 0.3–1.2)
Total Protein: 7.8 g/dL (ref 6.5–8.1)

## 2021-05-28 LAB — CBC WITH DIFFERENTIAL (CANCER CENTER ONLY)
Abs Immature Granulocytes: 0.03 10*3/uL (ref 0.00–0.07)
Basophils Absolute: 0.1 10*3/uL (ref 0.0–0.1)
Basophils Relative: 1 %
Eosinophils Absolute: 0.2 10*3/uL (ref 0.0–0.5)
Eosinophils Relative: 2 %
HCT: 43 % (ref 36.0–46.0)
Hemoglobin: 13.7 g/dL (ref 12.0–15.0)
Immature Granulocytes: 0 %
Lymphocytes Relative: 19 %
Lymphs Abs: 1.6 10*3/uL (ref 0.7–4.0)
MCH: 28.6 pg (ref 26.0–34.0)
MCHC: 31.9 g/dL (ref 30.0–36.0)
MCV: 89.8 fL (ref 80.0–100.0)
Monocytes Absolute: 1 10*3/uL (ref 0.1–1.0)
Monocytes Relative: 11 %
Neutro Abs: 5.9 10*3/uL (ref 1.7–7.7)
Neutrophils Relative %: 67 %
Platelet Count: 275 10*3/uL (ref 150–400)
RBC: 4.79 MIL/uL (ref 3.87–5.11)
RDW: 14.8 % (ref 11.5–15.5)
WBC Count: 8.8 10*3/uL (ref 4.0–10.5)
nRBC: 0 % (ref 0.0–0.2)

## 2021-05-28 NOTE — Progress Notes (Signed)
[facility]   [phone] Fax:(336) [phone]   Clinic Follow up Note   Patient Care Team: [name], DO as PCP - General (Family Medicine) [name], DO as Consulting Physician (Family Medicine) Avon Gully, NP as Nurse Practitioner (Obstetrics and Gynecology) Thea Silversmith, MD as Consulting Physician (Radiation Oncology) Gatha Mayer, MD as Consulting Physician (Radiation Oncology) Marcy Panning, MD as Consulting Physician (Hematology and Oncology)  Date of Service:  05/28/2021  CHIEF COMPLAINT: f/u of left breast cancer  SUMMARY OF ONCOLOGIC HISTORY: Oncology History Overview Note  Cancer Staging Malignant neoplasm of upper-outer quadrant of left breast in female, estrogen receptor positive (Kristina Curry) Staging form: Breast, AJCC 7th Edition - Clinical: Stage IA (T1c, N0, cM0) - Unsigned Staging comments: Staged in breast conference 8.21.13  - Pathologic: No stage assigned - Unsigned    Malignant neoplasm of upper-outer quadrant of left breast in female, estrogen receptor positive (Kristina Curry)  07/20/2012 Initial Biopsy   Biopsy found  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    07/21/2012 Initial Diagnosis   Malignant neoplasm of upper-outer quadrant of left breast in female, estrogen receptor positive (Kristina Curry)    08/11/2012 Pathology Results   Final pathology showed  1.7 cm grade 1 with DCIS low to intermediate grade, margins negative and one sentinel node negative     - 11/17/2012 Radiation Therapy   Completed in [place] by Dr. Orlene Erm     Oncotype testing   Dx score 18 = risk of recurrence 11%.   08/11/2012 Cancer Staging   Staging form: Breast, AJCC 7th Edition - Pathologic stage from 08/11/2012: Stage IA (T1c, N0, cM0) - Signed by Truitt Merle, MD on 10/31/2020    11/2012 - 11/2017 Anti-estrogen oral therapy   Began Anastrozole 11/2012  Was held x2 weeks spring 2017, with  full resolution of arthralgias, then resumed   Completed in 11/2017.       CURRENT THERAPY:  Surveillance Zometa every 6 months for osteopenia  INTERVAL HISTORY:  Kristina Curry is here for a follow up of breast cancer. She was last seen by me on 11/03/20. She presents to the clinic accompanied by her husband. She is doing well and voices no concerns today.  All other systems were reviewed with the patient and are negative.  MEDICAL HISTORY:  Past Medical History:  Diagnosis Date   Arthritis    Back pain    Breast cancer (Kristina Curry)    Dribbling of urine    Fatigue    GERD (gastroesophageal reflux disease)    Headache(784.0)    Osteoporosis, unspecified 07/16/2013   Ringing in ears    Sinus complaint    Skin cancer     SURGICAL HISTORY: Past Surgical History:  Procedure Laterality Date   BREAST LUMPECTOMY  08/21/12   COLONOSCOPY     EYE SURGERY  1986   lt cataract   ORIF PATELLA Left 05/27/2020   Procedure: OPEN REDUCTION INTERNAL (ORIF) FIXATION LEFT PATELLA;  Surgeon: Paralee Cancel, MD;  Location: [facility];  Service: Orthopedics;  Laterality: Left;   TUBAL LIGATION  1982    I have reviewed the social history and family history with the patient and they are unchanged from previous note.  ALLERGIES:  has No Known Allergies.  MEDICATIONS:  Current Outpatient Medications  Medication Sig Dispense Refill   ALPRAZolam (XANAX) 1 MG tablet Take 1 mg by mouth at bedtime as needed for anxiety  or sleep.     Calcium Carb-Cholecalciferol (CALCIUM 600 + D PO) Take 1 tablet by mouth 2 (two) times daily.     docusate sodium (COLACE) 100 MG capsule Take 1 capsule (100 mg total) by mouth 2 (two) times daily. 28 capsule 0   fluticasone (FLONASE) 50 MCG/ACT nasal spray Place 1 spray into both nostrils daily.      Pseudoephedrine HCl (SUDAFED CONGESTION PO) Take 2 tablets by mouth 2 (two) times daily as needed (congestion).      zolpidem (AMBIEN) 5 MG tablet Take 5 mg by mouth at bedtime  as needed for sleep.      No current facility-administered medications for this visit.    PHYSICAL EXAMINATION: ECOG PERFORMANCE STATUS: 0 - Asymptomatic  Vitals:   05/28/21 1332  BP: 122/75  Pulse: 71  Resp: 18  Temp: (!) 97.2 F (36.2 C)  SpO2: 95%   Filed Weights   05/28/21 1332  Weight: 183 lb 11.2 oz (83.3 kg)    GENERAL:alert, no distress and comfortable SKIN: skin color, texture, turgor are normal, no rashes or significant lesions EYES: normal, Conjunctiva are pink and non-injected, sclera clear  NECK: supple, thyroid normal size, non-tender, without nodularity LYMPH:  no palpable lymphadenopathy in the cervical, axillary  ABDOMEN:abdomen soft, non-tender and normal bowel sounds Musculoskeletal:no cyanosis of digits and no clubbing  NEURO: alert & oriented x 3 with fluent speech, no focal motor/sensory deficits  LABORATORY DATA:  I have reviewed the data as listed CBC Latest Ref Rng & Units 05/28/2021 03/13/2021 11/03/2020  WBC 4.0 - 10.5 K/uL 8.8 5.9 6.0  Hemoglobin 12.0 - 15.0 g/dL 13.7 13.8 13.9  Hematocrit 36.0 - 46.0 % 43.0 43.0 43.8  Platelets 150 - 400 K/uL 275 255 306     CMP Latest Ref Rng & Units 05/28/2021 03/13/2021 11/03/2020  Glucose 70 - 99 mg/dL 91 88 90  BUN 8 - 23 mg/dL _0 Creatinine 0.44 - 1.00 mg/dL 0.85 0.79 0.75  Sodium 135 - 145 mmol/L 139 141 140  Potassium 3.5 - 5.1 mmol/L 4.0 3.8 3.8  Chloride 98 - 111 mmol/L 105 107 107  CO2 22 - 32 mmol/L _1 Calcium 8.9 - 10.3 mg/dL 9.5 8.8(L) 9.4  Total Protein 6.5 - 8.1 g/dL 7.8 7.5 7.5  Total Bilirubin 0.3 - 1.2 mg/dL 0.4 0.4 0.4  Alkaline Phos 38 - 126 U/L 56 57 59  AST 15 - 41 U/L 16 15 14(L)  ALT 0 - 44 U/L _2 RADIOGRAPHIC STUDIES: I have personally reviewed the radiological images as listed and agreed with the findings in the report. No results found.   ASSESSMENT & PLAN:  Kristina Curry is a 75 y.o. female with   1. Malignant neoplasm of upper-outer  quadrant of left breast, invasive ductal carcinoma, G1, T1N0 stage IA, ER+/ PR + HER2- -She was diagnosed 07/2012, treated with left lumpectomy and one sentinel node in 08/2012, local radiation and 5 years of Anastrozole 11/2012-11/2017. -From a breast cancer standpoint, she is clinically doing well. Lab reviewed, her CBC is within normal limits, CMP pending. Her physical exam today and her 11/2020 mammogram were unremarkable.  -She will continue surveillance with yearly mammogram and breast exams.    2. Osteopenia and fracture after fall  -Bone density scan was done on 10/18/2016, with T score -1.9 in the right femur neck, osteopenia. Her 11/2018 DEXA showed mild improved with lowest T-score -1.6.  -  Her most recent DEXA 01/2021 showed -1.8. -Continue calcium (2x daily) and Vitamin D.  -She began Zometa q42month in 03/2021. she tolerated first dose very well    3. 05/2020 Left patellar Fracture, 06/2020 B/l PE -After a fall down the steps in 05/2020 which resulted in left patellar fracture. She required surgery on 05/27/20. She has recovered well but continues to have stability issues. She was recommended to strengthen her quads, which she is working on. -She had b/l PE in 06/2020, likely provoked from surgery and decreased mobility. She completed 3 months of blood thinner. No signs of recurrence.   4. Chronic back pain, Tolerable and stable.   5. H/o Basal skin cancer. [facility]. Continue f[facility]u     PLAN -next Zometa in 09/2021  [facility] -Lab, F/u, and Zometa in 03/2022.    No problem-specific Assessment & Plan notes found for this encounter.   No orders of the defined types were placed in this encounter.  All questions were answered. The patient knows to call the clinic with any problems, questions or concerns. No barriers to learning was detected. The total time spent in the appointment was 25 minutes.     YTruitt Merle MD 05/28/2021   I, KWilburn Mylar am acting as scribe for YTruitt Merle MD.   I have reviewed the above documentation for accuracy and completeness, and I agree with the above.

## 2021-05-29 ENCOUNTER — Telehealth: Payer: Self-pay | Admitting: Hematology

## 2021-05-29 NOTE — Telephone Encounter (Signed)
Scheduled follow-up appointments per 6/23 los. Patient is aware. Mailed calendar.

## 2021-09-10 ENCOUNTER — Other Ambulatory Visit: Payer: Medicare Other

## 2021-09-10 ENCOUNTER — Ambulatory Visit: Payer: Medicare Other

## 2021-09-16 ENCOUNTER — Inpatient Hospital Stay: Payer: Medicare Other

## 2021-09-16 ENCOUNTER — Inpatient Hospital Stay: Payer: Medicare Other | Attending: Hematology

## 2021-09-16 ENCOUNTER — Other Ambulatory Visit: Payer: Self-pay

## 2021-09-16 VITALS — BP 127/74 | HR 61 | Temp 98.2°F | Resp 20

## 2021-09-16 DIAGNOSIS — M810 Age-related osteoporosis without current pathological fracture: Secondary | ICD-10-CM | POA: Diagnosis not present

## 2021-09-16 DIAGNOSIS — C50412 Malignant neoplasm of upper-outer quadrant of left female breast: Secondary | ICD-10-CM | POA: Diagnosis not present

## 2021-09-16 DIAGNOSIS — Z170 Estrogen receptor positive status [ER+]: Secondary | ICD-10-CM | POA: Insufficient documentation

## 2021-09-16 DIAGNOSIS — Z79811 Long term (current) use of aromatase inhibitors: Secondary | ICD-10-CM | POA: Insufficient documentation

## 2021-09-16 LAB — CBC WITH DIFFERENTIAL (CANCER CENTER ONLY)
Abs Immature Granulocytes: 0.02 10*3/uL (ref 0.00–0.07)
Basophils Absolute: 0.1 10*3/uL (ref 0.0–0.1)
Basophils Relative: 1 %
Eosinophils Absolute: 0.2 10*3/uL (ref 0.0–0.5)
Eosinophils Relative: 3 %
HCT: 44 % (ref 36.0–46.0)
Hemoglobin: 14.1 g/dL (ref 12.0–15.0)
Immature Granulocytes: 0 %
Lymphocytes Relative: 26 %
Lymphs Abs: 1.6 10*3/uL (ref 0.7–4.0)
MCH: 29.1 pg (ref 26.0–34.0)
MCHC: 32 g/dL (ref 30.0–36.0)
MCV: 90.9 fL (ref 80.0–100.0)
Monocytes Absolute: 0.7 10*3/uL (ref 0.1–1.0)
Monocytes Relative: 12 %
Neutro Abs: 3.5 10*3/uL (ref 1.7–7.7)
Neutrophils Relative %: 58 %
Platelet Count: 248 10*3/uL (ref 150–400)
RBC: 4.84 MIL/uL (ref 3.87–5.11)
RDW: 14.9 % (ref 11.5–15.5)
WBC Count: 6.1 10*3/uL (ref 4.0–10.5)
nRBC: 0 % (ref 0.0–0.2)

## 2021-09-16 LAB — CMP (CANCER CENTER ONLY)
ALT: 16 U/L (ref 0–44)
AST: 18 U/L (ref 15–41)
Albumin: 3.9 g/dL (ref 3.5–5.0)
Alkaline Phosphatase: 40 U/L (ref 38–126)
Anion gap: 6 (ref 5–15)
BUN: 19 mg/dL (ref 8–23)
CO2: 26 mmol/L (ref 22–32)
Calcium: 9.3 mg/dL (ref 8.9–10.3)
Chloride: 112 mmol/L — ABNORMAL HIGH (ref 98–111)
Creatinine: 0.76 mg/dL (ref 0.44–1.00)
GFR, Estimated: >60 mL/min (ref >60)
Glucose, Bld: 87 mg/dL (ref 70–99)
Potassium: 4 mmol/L (ref 3.5–5.1)
Sodium: 144 mmol/L (ref 135–145)
Total Bilirubin: 0.4 mg/dL (ref 0.3–1.2)
Total Protein: 7.5 g/dL (ref 6.5–8.1)

## 2021-09-16 MED ORDER — SODIUM CHLORIDE 0.9 % IV SOLN: Freq: Once | INTRAVENOUS | Status: AC

## 2021-09-16 MED ORDER — ZOLEDRONIC ACID 4 MG/100ML IV SOLN
4.0000 mg | Freq: Once | INTRAVENOUS | Status: AC
  Administered 2021-09-16: 4 mg via INTRAVENOUS
  Filled 2021-09-16: qty 100

## 2021-09-16 NOTE — Patient Instructions (Signed)

## 2021-09-17 ENCOUNTER — Ambulatory Visit: Payer: Medicare Other

## 2021-09-17 ENCOUNTER — Other Ambulatory Visit: Payer: Medicare Other

## 2022-01-11 IMAGING — CR DG KNEE COMPLETE 4+V*L*
4 series · 4 of 4 positions shown · non-contrast
Comparison: None.

CLINICAL DATA: Post fall with known patellar fracture.

EXAM:
LEFT KNEE - COMPLETE 4+ VIEW

[x knee ap left]
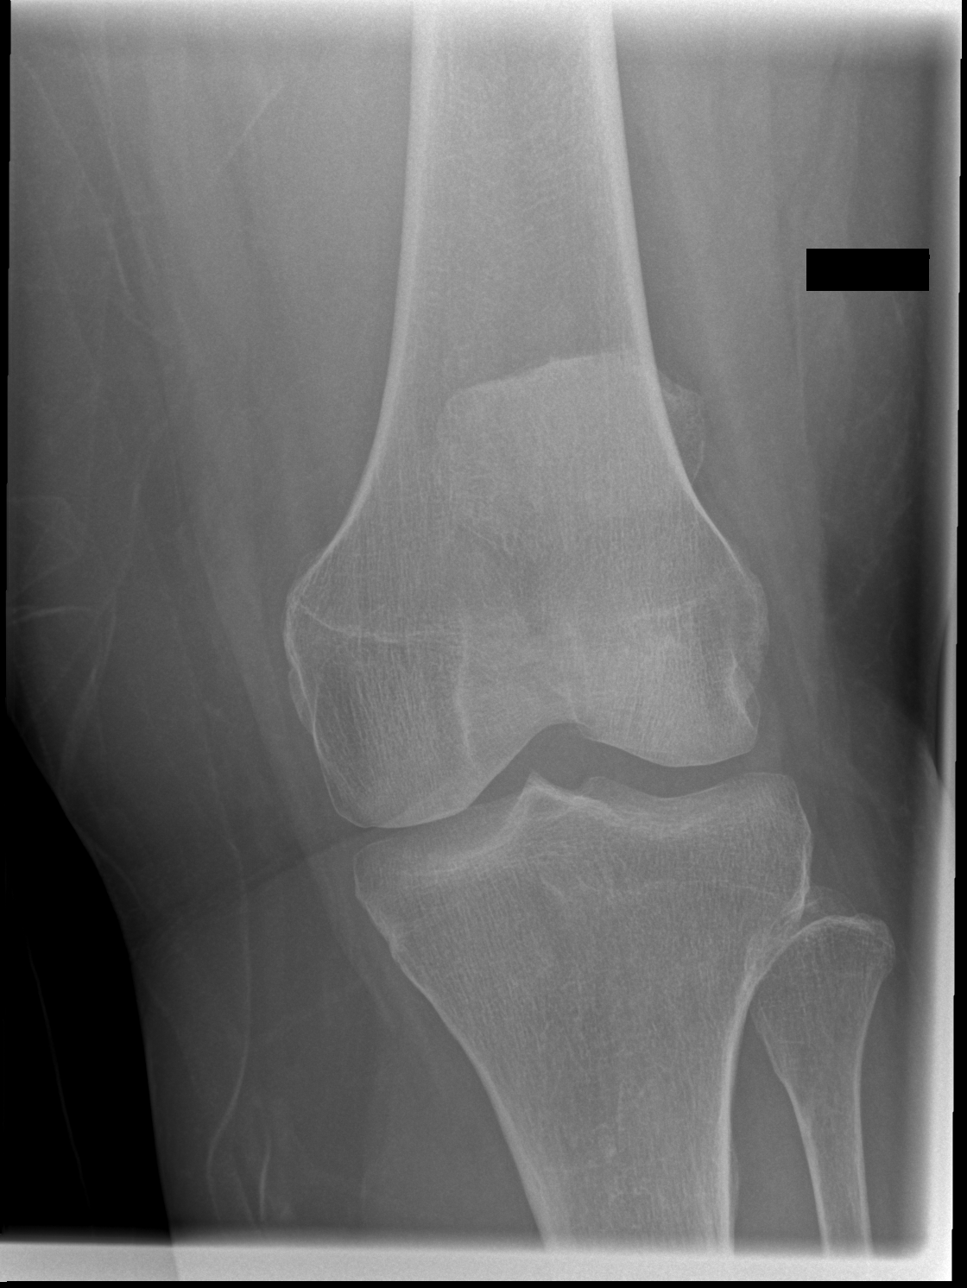

[x knee obl left (1 of 2)]
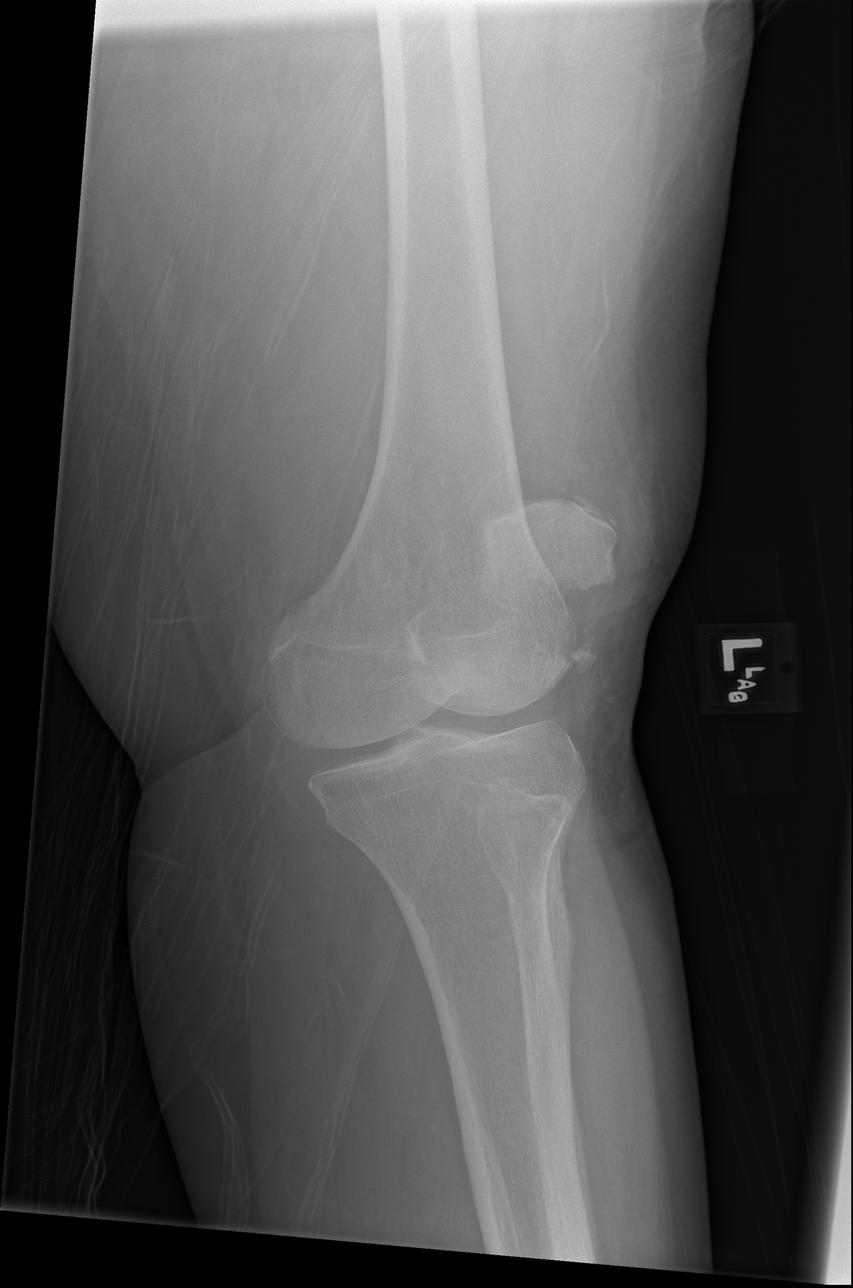

[x knee obl left (2 of 2)]
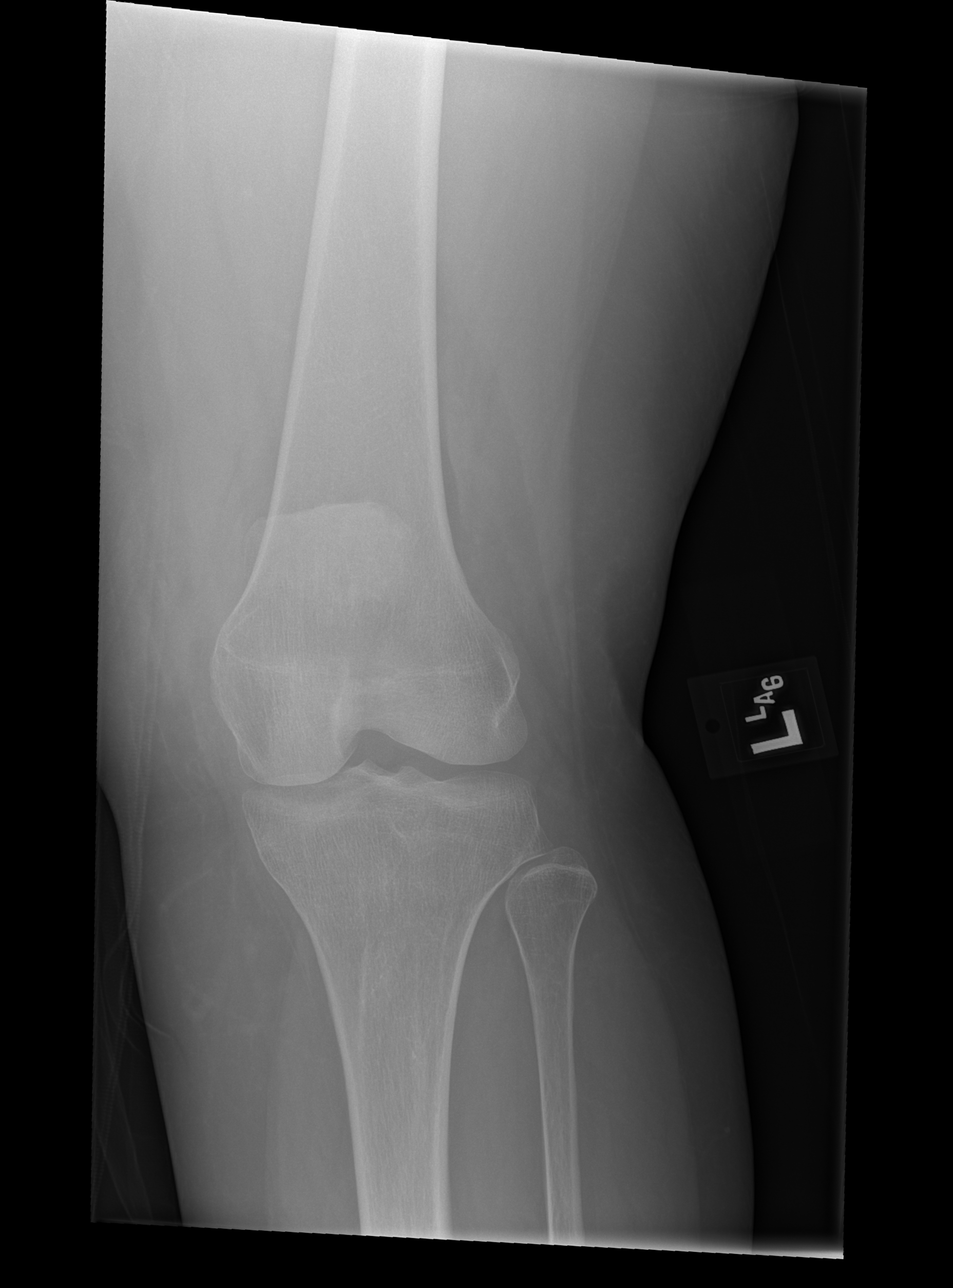

[x knee lat left]
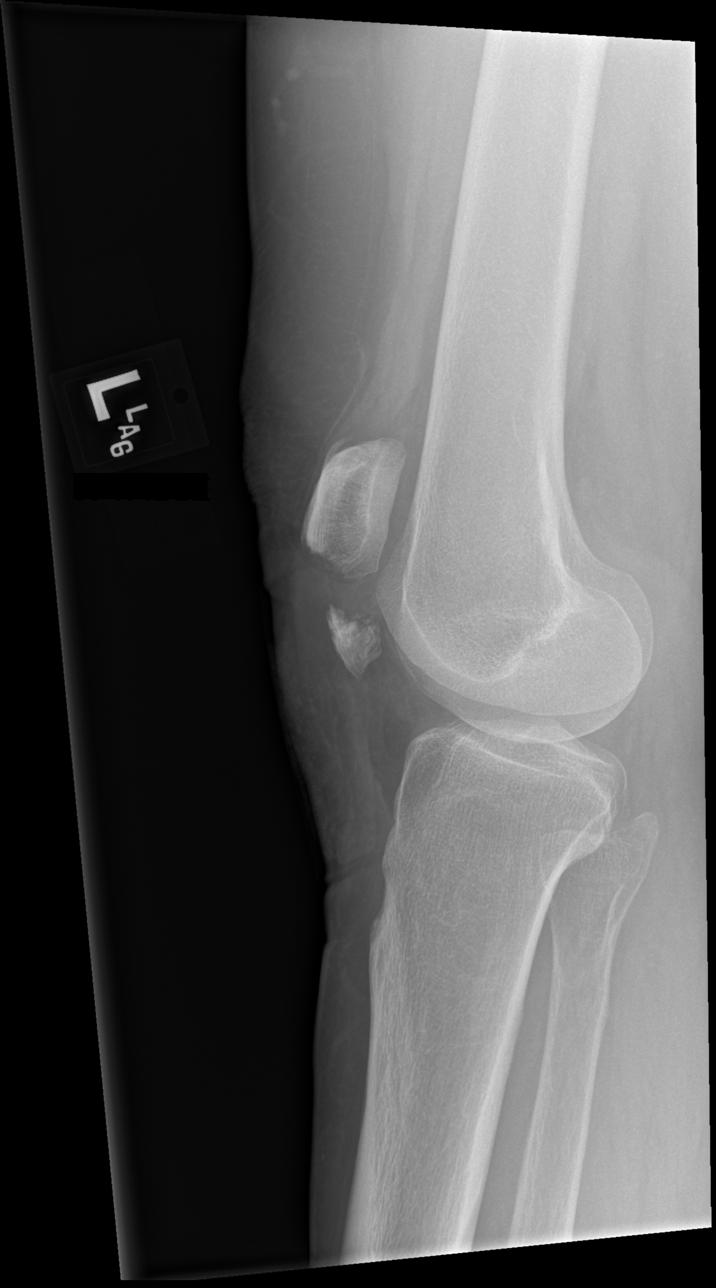

[4 of 4 positions shown; findings below may reference images not displayed]

FINDINGS: Displaced transversely oriented fracture through the lower patellar
pole has 16 mm osseous distraction. Mild superior migration of the
proximal fracture fragment. No additional fracture of the knee.
Normal tibiofemoral alignment. Trace joint effusion.
IMPRESSION: Displaced patellar fracture with 16 mm osseous distraction.

## 2022-03-18 ENCOUNTER — Other Ambulatory Visit: Payer: Self-pay

## 2022-03-18 ENCOUNTER — Inpatient Hospital Stay: Payer: Medicare Other

## 2022-03-18 ENCOUNTER — Encounter: Payer: Self-pay | Admitting: Hematology

## 2022-03-18 ENCOUNTER — Inpatient Hospital Stay: Payer: Medicare Other | Attending: Hematology | Admitting: Hematology

## 2022-03-18 ENCOUNTER — Telehealth: Payer: Self-pay | Admitting: Hematology

## 2022-03-18 VITALS — BP 114/69 | HR 66 | Temp 97.9°F | Resp 18 | Ht 63.0 in | Wt 191.5 lb

## 2022-03-18 DIAGNOSIS — C50412 Malignant neoplasm of upper-outer quadrant of left female breast: Secondary | ICD-10-CM | POA: Diagnosis not present

## 2022-03-18 DIAGNOSIS — Z170 Estrogen receptor positive status [ER+]: Secondary | ICD-10-CM

## 2022-03-18 DIAGNOSIS — E559 Vitamin D deficiency, unspecified: Secondary | ICD-10-CM | POA: Diagnosis not present

## 2022-03-18 DIAGNOSIS — M8580 Other specified disorders of bone density and structure, unspecified site: Secondary | ICD-10-CM | POA: Diagnosis not present

## 2022-03-18 DIAGNOSIS — M810 Age-related osteoporosis without current pathological fracture: Secondary | ICD-10-CM

## 2022-03-18 LAB — CMP (CANCER CENTER ONLY)
ALT: 16 U/L (ref 0–44)
AST: 18 U/L (ref 15–41)
Albumin: 4.1 g/dL (ref 3.5–5.0)
Alkaline Phosphatase: 41 U/L (ref 38–126)
Anion gap: 6 (ref 5–15)
BUN: 19 mg/dL (ref 8–23)
CO2: 24 mmol/L (ref 22–32)
Calcium: 9 mg/dL (ref 8.9–10.3)
Chloride: 108 mmol/L (ref 98–111)
Creatinine: 0.77 mg/dL (ref 0.44–1.00)
GFR, Estimated: >60 mL/min (ref >60)
Glucose, Bld: 90 mg/dL (ref 70–99)
Potassium: 4 mmol/L (ref 3.5–5.1)
Sodium: 138 mmol/L (ref 135–145)
Total Bilirubin: 0.4 mg/dL (ref 0.3–1.2)
Total Protein: 7.7 g/dL (ref 6.5–8.1)

## 2022-03-18 LAB — CBC WITH DIFFERENTIAL (CANCER CENTER ONLY)
Abs Immature Granulocytes: 0.01 10*3/uL (ref 0.00–0.07)
Basophils Absolute: 0.1 10*3/uL (ref 0.0–0.1)
Basophils Relative: 1 %
Eosinophils Absolute: 0.1 10*3/uL (ref 0.0–0.5)
Eosinophils Relative: 2 %
HCT: 44.9 % (ref 36.0–46.0)
Hemoglobin: 14.3 g/dL (ref 12.0–15.0)
Immature Granulocytes: 0 %
Lymphocytes Relative: 21 %
Lymphs Abs: 1.5 10*3/uL (ref 0.7–4.0)
MCH: 28.5 pg (ref 26.0–34.0)
MCHC: 31.8 g/dL (ref 30.0–36.0)
MCV: 89.4 fL (ref 80.0–100.0)
Monocytes Absolute: 0.7 10*3/uL (ref 0.1–1.0)
Monocytes Relative: 10 %
Neutro Abs: 4.5 10*3/uL (ref 1.7–7.7)
Neutrophils Relative %: 66 %
Platelet Count: 267 10*3/uL (ref 150–400)
RBC: 5.02 MIL/uL (ref 3.87–5.11)
RDW: 14.6 % (ref 11.5–15.5)
WBC Count: 6.8 10*3/uL (ref 4.0–10.5)
nRBC: 0 % (ref 0.0–0.2)

## 2022-03-18 LAB — VITAMIN D 25 HYDROXY (VIT D DEFICIENCY, FRACTURES): Vit D, 25-Hydroxy: 56.37 ng/mL (ref 30–100)

## 2022-03-18 MED ORDER — ZOLEDRONIC ACID 4 MG/100ML IV SOLN
4.0000 mg | Freq: Once | INTRAVENOUS | Status: AC
  Administered 2022-03-18: 4 mg via INTRAVENOUS
  Filled 2022-03-18: qty 100

## 2022-03-18 MED ORDER — SODIUM CHLORIDE 0.9 % IV SOLN: Freq: Once | INTRAVENOUS | Status: AC

## 2022-03-18 NOTE — Patient Instructions (Signed)

## 2022-03-18 NOTE — Telephone Encounter (Signed)
Left message with follow-up appointment per 4/13 los. ?

## 2022-03-18 NOTE — Progress Notes (Signed)
?[place]   ?Telephone:(336) [phone] [phone]   ?Clinic Follow up Note  ? ?Patient Care Team: ?[name], DO as PCP - General (Family Medicine) ?[name], DO as Consulting Physician (Family Medicine) ?Avon Gully, NP as Nurse Practitioner (Obstetrics and Gynecology) ?Thea Silversmith, MD as Consulting Physician (Radiation Oncology) ?Gatha Mayer, MD as Consulting Physician (Radiation Oncology) ?Marcy Panning, MD as Consulting Physician (Hematology and Oncology) ? ?Date of Service:  03/18/2022 ? ?CHIEF COMPLAINT: f/u of left breast cancer ? ?CURRENT THERAPY:  ?Surveillance ? ?ASSESSMENT & PLAN:  ?Kristina Curry is a 76 y.o. post-menopausal female with  ? ?1. Malignant neoplasm of upper-outer quadrant of left breast, invasive ductal carcinoma, G1, T1N0 stage IA, ER+/ PR + HER2- ?-She was diagnosed 07/2012, treated with left lumpectomy and one sentinel node in 08/2012, local radiation and 5 years of Anastrozole 11/2012-11/2017. ?-most recent mamm[facility] on 12/09/21 [facility] was benign. ?-From a breast cancer standpoint, she is clinically doing well. Lab reviewed, her CBC is within normal limits, CMP pending. Her physical exam was unremarkable.  ?-She will continue surveillance with yearly mammogram and breast exams.  ?  ?2. Osteopenia and fracture after fall  ?-Her 11/2018 DEXA showed mild improved with lowest T-score -1.6.  ?-Her most recent DEXA 01/2021 showed -1.8. ?-Continue calcium (2x daily) and Vitamin D.  ?-She began Zometa q51month in 03/2021. She tolerates well with no side effects. She will receive her third dose today.  ? ?3. 05/2020 Left patellar Fracture, 06/2020 B/l PE ?-After a fall down the steps in 05/2020 which resulted in left patellar fracture. She required surgery on 05/27/20. She has recovered well but continues to have stability issues. ?-She had b/l PE in 06/2020, likely provoked from surgery and decreased mobility. She completed 3 months of blood thinner. No signs  of recurrence. ?  ?4. Chronic back pain, Tolerable and stable. ?  ?5. H/o Basal skin cancer. [facility] Continue f/u ?  ?  ?PLAN ?-proceed with third Zometa today ?-Lab, F/u with NP Lacie, and Zometa in 09/2022.  ?-mammogram and DEXA will be due 01/2023 ? ? ?No problem-specific Assessment & Plan notes found for this encounter. ? ? ?SUMMARY OF ONCOLOGIC HISTORY: ?Oncology History Overview Note  ?Cancer Staging ?Malignant neoplasm of upper-outer quadrant of left breast in female, estrogen receptor positive ([place] ?Staging form: Breast, AJCC 7th Edition ?- Clinical: Stage IA (T1c, N0, cM0) - Unsigned ?Staging comments: Staged in breast conference 8.21.13 ? ?- Pathologic: No stage assigned - Unsigned ? ?  ?Malignant neoplasm of upper-outer quadrant of left breast in female, estrogen receptor positive ([place]  ?07/20/2012 Initial Biopsy  ? Biopsy found  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 ?  ?07/21/2012 Initial Diagnosis  ? Malignant neoplasm of upper-outer quadrant of left breast in female, estrogen receptor positive ([place] ?  ?08/11/2012 Pathology Results  ? Final pathology showed  1.7 cm grade 1 with DCIS low to intermediate grade, margins negative and one sentinel node negative ?  ? - 11/17/2012 [place]  [place]  ? Oncotype testing  ? Dx score 18 = risk of recurrence 11%. ?  ?08/11/2012 Cancer Staging  ? Staging form: Breast, AJCC 7th Edition ?- Pathologic stage from 08/11/2012: Stage IA (T1c, N0, cM0) - Signed by FTruitt Merle MD on 10/31/2020 ? ?  ?11/2012 - 11/2017 Anti-estrogen oral  therapy  ? Began Anastrozole 11/2012 ? ?Was held x2 weeks spring 2017, with full resolution of arthralgias, then resumed  ? ?Completed in 11/2017.  ?  ? ? ? ?INTERVAL HISTORY:  ?Kristina Curry is here for a follow up of breast cancer. She was last seen by me on 05/28/21. She presents to the  clinic alone. ?She reports she is doing well overall, no new concerns. She reports she tolerated Zometa infusion well with no issues.  ?She notes she continues to have knee issues since her knee injury in 2021. ?  ?All other systems were reviewed with the patient and are negative. ? ?MEDICAL HISTORY:  ?Past Medical History:  ?Diagnosis Date  ? Arthritis   ? Back pain   ? Breast cancer ([place])   ? Dribbling of urine   ? Fatigue   ? GERD (gastroesophageal reflux disease)   ? Headache(784.0)   ? Osteoporosis, unspecified 07/16/2013  ? Ringing in ears   ? Sinus complaint   ? Skin cancer   ? ? ?SURGICAL HISTORY: ?Past Surgical History:  ?Procedure Laterality Date  ? BREAST LUMPECTOMY  08/21/12  ? COLONOSCOPY    ? EYE SURGERY  1986  ? lt cataract  ? ORIF PATELLA Left 05/27/2020  ? Procedure: OPEN REDUCTION INTERNAL (ORIF) FIXATION LEFT PATELLA;  Surgeon: Paralee Cancel, MD;  Location: [facility];  Service: Orthopedics;  Laterality: Left;  ? TUBAL LIGATION  1982  ? ? ?I have reviewed the social history and family history with the patient and they are unchanged from previous note. ? ?ALLERGIES:  has No Known Allergies. ? ?MEDICATIONS:  ?Current Outpatient Medications  ?Medication Sig Dispense Refill  ? ALPRAZolam (XANAX) 1 MG tablet Take 1 mg by mouth at bedtime as needed for anxiety or sleep.    ? Calcium Carb-Cholecalciferol (CALCIUM 600 + D PO) Take 1 tablet by mouth 2 (two) times daily.    ? diphenoxylate-atropine (LOMOTIL) 2.5-0.025 MG tablet Take 1 tablet by mouth 3 (three) times daily as needed.    ? docusate sodium (COLACE) 100 MG capsule Take 1 capsule (100 mg total) by mouth 2 (two) times daily. (Patient not taking: Reported on 09/16/2021) 28 capsule 0  ? fluticasone (FLONASE) 50 MCG/ACT nasal spray Place 1 spray into both nostrils daily.     ? Pseudoephedrine HCl (SUDAFED CONGESTION PO) Take 2 tablets by mouth 2 (two) times daily as needed (congestion).     ? zolpidem (AMBIEN) 5 MG tablet Take 5 mg by mouth at bedtime as  needed for sleep.     ? ?No current facility-administered medications for this visit.  ? ? ?PHYSICAL EXAMINATION: ?ECOG PERFORMANCE STATUS: 0 - Asymptomatic ? ?Vitals:  ? 03/18/22 1333  ?BP: 114/69  ?Pulse: 66  ?Resp: 18  ?Temp: 97.9 ?F (36.6 ?C)  ?SpO2: 97%  ? ?Wt Readings from Last 3 Encounters:  ?03/18/22 191 lb 8 oz (86.9 kg)  ?05/28/21 183 lb 11.2 oz (83.3 kg)  ?11/03/20 185 lb 14.4 oz (84.3 kg)  ?  ? ?GENERAL:alert, no distress and comfortable ?SKIN: skin color, texture, turgor are normal, no rashes or significant lesions ?EYES: normal, Conjunctiva are pink and non-injected, sclera clear  ?NECK: supple, thyroid normal size, non-tender, without nodularity ?LYMPH:  no palpable lymphadenopathy in the cervical, axillary ?LUNGS: clear to auscultation and percussion with normal breathing effort ?HEART: regular rate & rhythm and no murmurs and no lower extremity edema ?ABDOMEN:abdomen soft, non-tender and normal bowel sounds ?Musculoskeletal:no cyanosis of digits and no clubbing  ?  NEURO: alert & oriented x 3 with fluent speech, no focal motor/sensory deficits ?BREAST: No palpable mass, nodules or adenopathy bilaterally. Breast exam benign.  ? ?LABORATORY DATA:  ?I have reviewed the data as listed ? ?  Latest Ref Rng & Units 03/18/2022  ?  1:18 PM 09/16/2021  ? 11:44 AM 05/28/2021  ?  1:26 PM  ?CBC  ?WBC 4.0 - 10.5 K/uL 6.8   6.1   8.8    ?Hemoglobin 12.0 - 15.0 g/dL 14.3   14.1   13.7    ?Hematocrit 36.0 - 46.0 % 44.9   44.0   43.0    ?Platelets 150 - 400 K/uL 267   248   275    ? ? ? ? ?  Latest Ref Rng & Units 03/18/2022  ?  1:18 PM 09/16/2021  ? 11:44 AM 05/28/2021  ?  1:26 PM  ?CMP  ?Glucose 70 - 99 mg/dL 90   87   91    ?BUN 8 - 23 mg/dL _0 ?Creatinine 0.44 - 1.00 mg/dL 0.77   0.76   0.85    ?Sodium 135 - 145 mmol/L 138   144   139    ?Potassium 3.5 - 5.1 mmol/L 4.0   4.0   4.0    ?Chloride 98 - 111 mmol/L 108   112   105    ?CO2 22 - 32 mmol/L _1 ?Calcium 8.9 - 10.3 mg/dL 9.0   9.3   9.5     ?Total Protein 6.5 - 8.1 g/dL 7.7   7.5   7.8    ?Total Bilirubin 0.3 - 1.2 mg/dL 0.4   0.4   0.4    ?Alkaline Phos 38 - 126 U/L 41   40   56    ?AST 15 - 41 U/L _2 ?ALT 0 - 44 U/L 16   16

## 2022-03-30 ENCOUNTER — Telehealth: Payer: Self-pay | Admitting: Hematology

## 2022-03-30 NOTE — Telephone Encounter (Signed)
.  Called patient to schedule appointment per 4/25 inbasket, patient is aware of date and time.  Pt requested specific time, appt r/s with [facility] ?

## 2022-09-14 NOTE — Progress Notes (Unsigned)
[facility] OFFICE PROGRESS NOTE  Kristina Fear, DO [address] [place] [address]  DIAGNOSIS:  f/u of left breast cancer  Oncology History Overview Note  Cancer Staging Malignant neoplasm of upper-outer quadrant of left breast in female, estrogen receptor positive ([place]) Staging form: Breast, AJCC 7th Edition - Clinical: Stage IA (T1c, N0, cM0) - Unsigned Staging comments: Staged in breast conference 8.21.13  - Pathologic: No stage assigned - Unsigned    Malignant neoplasm of upper-outer quadrant of left breast in female, estrogen receptor positive ([place])  07/20/2012 Initial Biopsy   Biopsy found  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   07/21/2012 Initial Diagnosis   Malignant neoplasm of upper-outer quadrant of left breast in female, estrogen receptor positive ([place])   08/11/2012 Pathology Results   Final pathology showed  1.7 cm grade 1 with DCIS low to intermediate grade, margins negative and one sentinel node negative    - 11/17/2012 Radiation Therapy   Completed in [place] by Dr. Orlene Erm    Oncotype testing   Dx score 18 = risk of recurrence 11%.   08/11/2012 Cancer Staging   Staging form: Breast, AJCC 7th Edition - Pathologic stage from 08/11/2012: Stage IA (T1c, N0, cM0) - Signed by Truitt Merle, MD on 10/31/2020   11/2012 - 11/2017 Anti-estrogen oral therapy   Began Anastrozole 11/2012  Was held x2 weeks spring 2017, with full resolution of arthralgias, then resumed   Completed in 11/2017.       CURRENT THERAPY: Surveillance and Zometa every 6 months   INTERVAL HISTORY: Kristina Curry 76 y.o. female returns to the clinic today for a 3-monthfollow-up visit.  The patient was last seen in April 2023 by Dr. FBurr Medico  The patient is currently undergoing surveillance.  She has a history of stage I breast cancer diagnosed in 2013.  The patient is up-to-date on her mammograms the most recent  being in January 2023.  She [facility]. FErnestina Pennalast note and per chart review of her last Dexa scan being in 2022, she is also supposed to have a DEXA scan in January 2024. [facility]. The patient is taking calcium and vitamin D ~1-2 per day (if she remembers to take at nightly dose). The patient has a history of DVT/PE following surgery several years ago. Denies any signs or symptoms of blood clot at this time including calf pain, swelling, or erythema. Denies shortness of breath, chest pain, palpitations, or cough.   The patient denies any changes in her health since last being seen.  Denies any breast masses, lymphadenopathy, fevers, chills, unexplained weight loss, abdominal pain, unusual bone pain (besides her baseline chronic back pain which is stable), jaundice, or itching. The patient exercises 1-3x per week. She spends about 50 minutes at the gym per session. She walks on the treadmill as well as does leg lifts as she is trying to strengthen the leg that she has the history of the patella fracture on. She is also active golfing and bowling. She is here today for evaluation and repeat blood work before receiving her next Zometa injection.   MEDICAL HISTORY: Past Medical History:  Diagnosis Date   Arthritis    Back pain    [place]ast cancer [place]  Dribbling of urine    Fatigue    GERD (gastroesophageal reflux disease)    Headache(784.0)    Osteoporosis, unspecified 07/16/2013   Ringing in ears    Sinus complaint    Skin cancer     ALLERGIES:  has No Known Allergies.  MEDICATIONS:  Current Outpatient Medications  Medication Sig Dispense Refill   ALPRAZolam (XANAX) 1 MG tablet Take 1 mg by mouth at bedtime as needed for anxiety or sleep.     Calcium Carb-Cholecalciferol (CALCIUM 600 + D PO) Take 1 tablet by mouth 2 (two) times daily.      diphenoxylate-atropine (LOMOTIL) 2.5-0.025 MG tablet Take 1 tablet by mouth 3 (three) times daily as needed.     fluticasone (FLONASE) 50 MCG/ACT nasal spray Place 1 spray into both nostrils daily.      Pseudoephedrine HCl (SUDAFED CONGESTION PO) Take 2 tablets by mouth 2 (two) times daily as needed (congestion).      Psyllium (ONELAX FIBER THERAPY PO) Take by mouth.     zolpidem (AMBIEN) 5 MG tablet Take 5 mg by mouth at bedtime as needed for sleep.      No current facility-administered medications for this visit.    SURGICAL HISTORY:  Past Surgical History:  Procedure Laterality Date   BREAST LUMPECTOMY  08/21/12   COLONOSCOPY     EYE SURGERY  1986   lt cataract   ORIF PATELLA Left 05/27/2020   Procedure: OPEN REDUCTION INTERNAL (ORIF) FIXATION LEFT PATELLA;  Surgeon: Paralee Cancel, MD;  Location: [facility];  Service: Orthopedics;  Laterality: Left;   TUBAL LIGATION  1982    REVIEW OF SYSTEMS:   Review of Systems  Constitutional: Negative for appetite change, chills, fatigue, fever and unexpected weight change.  HENT: Negative for mouth sores, nosebleeds, sore throat and trouble swallowing.   Eyes: Negative for eye problems and icterus.  Respiratory: Negative for cough, hemoptysis, shortness of breath and wheezing.   Cardiovascular: Negative for chest pain and leg swelling.  Gastrointestinal: Negative for abdominal pain, constipation, diarrhea, nausea and vomiting.  Genitourinary: Negative for bladder incontinence, difficulty urinating, dysuria, frequency and hematuria.   Musculoskeletal: Positive for chronic back pain (stable). Negative for gait problem, neck pain and neck stiffness.  Skin: Negative for itching and rash.  Neurological: Negative for dizziness, extremity weakness, gait problem, headaches, light-headedness and seizures.  Hematological: Negative for adenopathy. Does not bruise/bleed easily.  Psychiatric/Behavioral: Negative for confusion, depression and sleep  disturbance. The patient is not nervous/anxious.     PHYSICAL EXAMINATION:  Blood pressure 117/78, pulse 65, temperature 97.8 F (36.6 C), temperature source Oral, resp. rate 17, weight 189 lb 5 oz (85.9 kg), SpO2 97 %.  ECOG PERFORMANCE STATUS: 0  Physical Exam  Constitutional: Oriented to person, place, and time and well-developed, well-nourished, and in no distress.  HENT:  Head: Normocephalic and atraumatic.  Mouth/Throat: Oropharynx is clear and moist. No oropharyngeal exudate.  Eyes: Conjunctivae are normal. Right eye exhibits no discharge. Left eye exhibits no discharge. No scleral icterus.  Neck: Normal range of motion. Neck supple.  Cardiovascular: Normal rate, regular rhythm, normal heart sounds and intact distal pulses.   Pulmonary/Chest: Effort normal and breath sounds normal. No respiratory distress. No wheezes. No rales.  Abdominal: Soft. Bowel sounds are normal. Exhibits no distension and no mass. There is no tenderness.  Musculoskeletal: Normal range of motion. Exhibits no edema.  Lymphadenopathy:    No cervical adenopathy.  Neurological: Alert and oriented to person, place, and time. Exhibits  normal muscle tone. Gait normal. Coordination normal.  Skin: Skin is warm and dry. No rash noted. Not diaphoretic. No erythema. No pallor.  Breasts: Breast inspection showed them to be symmetrical with no nipple discharge. Palpation of the breasts and axilla revealed no obvious mass that I could appreciate. Psychiatric: Mood, memory and judgment normal.  Vitals reviewed.  LABORATORY DATA: Lab Results  Component Value Date   WBC 6.0 09/15/2022   HGB 13.5 09/15/2022   HCT 40.8 09/15/2022   MCV 87.7 09/15/2022   PLT 244 09/15/2022      Chemistry      Component Value Date/Time   NA 138 03/18/2022 1318   NA 139 11/04/2017 1230   K 4.0 03/18/2022 1318   K 3.5 11/04/2017 1230   CL 108 03/18/2022 1318   CO2 24 03/18/2022 1318   CO2 24 11/04/2017 1230   BUN 19 03/18/2022  1318   BUN 19.5 11/04/2017 1230   CREATININE 0.77 03/18/2022 1318   CREATININE 0.9 11/04/2017 1230      Component Value Date/Time   CALCIUM 9.0 03/18/2022 1318   CALCIUM 9.1 11/04/2017 1230   ALKPHOS 41 03/18/2022 1318   ALKPHOS 62 11/04/2017 1230   AST 18 03/18/2022 1318   AST 18 11/04/2017 1230   ALT 16 03/18/2022 1318   ALT 14 11/04/2017 1230   BILITOT 0.4 03/18/2022 1318   BILITOT 0.32 11/04/2017 1230       RADIOGRAPHIC STUDIES:  No results found.   ASSESSMENT/PLAN:  Lorry Furber is a 76 y.o. post-menopausal female with    1. Malignant neoplasm of upper-outer quadrant of left breast, invasive ductal carcinoma, G1, T1N0 stage IA, ER+/ PR + HER2- -She was diagnosed 07/2012, treated with left lumpectomy and one sentinel node in 08/2012, local radiation and 5 years of Anastrozole 11/2012-11/2017. -most recent mamm[facility] on 12/09/21 at [facility] was benign. -From a breast cancer standpoint, she is clinically doing well. Lab reviewed, her CBC is within normal limits, CMP stable, slightly low calcium 8.8. Her physical exam was unremarkable.  -She will continue surveillance with yearly mammogram and breast exams. Next due on 12/2022. We will send over the request for the DEXA scan to [facility].    2. Osteopenia and fracture after fall  -Her 11/2018 DEXA showed mild improved with lowest T-score -1.6.  -Her most recent DEXA 01/2021 showed -1.8. -Continue calcium (1-2x daily) and Vitamin D.  -She began Zometa q60month in 03/2021. She tolerates well with no side effects. She will receive her fourth and last dose today.    3. 05/2020 Left patellar Fracture, 06/2020 B/l PE -After a fall down the steps in 05/2020 which resulted in left patellar fracture. She required surgery on 05/27/20. She has recovered well but continues to have stability issues. -She had b/l PE in 06/2020, likely provoked from surgery and decreased mobility. She completed 3 months of blood thinner. No signs of recurrence.   4.  Chronic back pain, Tolerable and stable.  -The patient did not bring this up at the appointment. I asked about her chronic back pain which she then told me is stable.   5. H/o Basal skin cancer. [facility] Continue [facility]/u     PLAN -proceed with fourth Zometa today -Lab, F/u with 6 months.  -mammogram and DEXA will be due 12/2022 -Slightly low calcium at 8.8. Encouraged to continue to take her calcium as zometa can affect her calcium. She sometimes forgets to take her night dose.  No orders of the defined types were placed in this encounter.   The total time spent in the appointment was 20-29 minutes.   Glennice Marcos L Carmelita Amparo, PA-C 09/15/22

## 2022-09-15 ENCOUNTER — Other Ambulatory Visit: Payer: Self-pay

## 2022-09-15 ENCOUNTER — Other Ambulatory Visit: Payer: Self-pay | Admitting: Physician Assistant

## 2022-09-15 ENCOUNTER — Inpatient Hospital Stay: Payer: Medicare Other

## 2022-09-15 ENCOUNTER — Inpatient Hospital Stay (HOSPITAL_BASED_OUTPATIENT_CLINIC_OR_DEPARTMENT_OTHER): Payer: Medicare Other | Admitting: Physician Assistant

## 2022-09-15 ENCOUNTER — Inpatient Hospital Stay: Payer: Medicare Other | Attending: Physician Assistant

## 2022-09-15 VITALS — BP 117/78 | HR 65 | Temp 97.8°F | Resp 17 | Wt 189.3 lb

## 2022-09-15 DIAGNOSIS — M810 Age-related osteoporosis without current pathological fracture: Secondary | ICD-10-CM

## 2022-09-15 DIAGNOSIS — Z1239 Encounter for other screening for malignant neoplasm of breast: Secondary | ICD-10-CM

## 2022-09-15 DIAGNOSIS — M8580 Other specified disorders of bone density and structure, unspecified site: Secondary | ICD-10-CM | POA: Insufficient documentation

## 2022-09-15 DIAGNOSIS — Z853 Personal history of malignant neoplasm of breast: Secondary | ICD-10-CM | POA: Diagnosis not present

## 2022-09-15 DIAGNOSIS — C50412 Malignant neoplasm of upper-outer quadrant of left female breast: Secondary | ICD-10-CM

## 2022-09-15 DIAGNOSIS — Z170 Estrogen receptor positive status [ER+]: Secondary | ICD-10-CM

## 2022-09-15 LAB — CBC WITH DIFFERENTIAL (CANCER CENTER ONLY)
Abs Immature Granulocytes: 0.02 10*3/uL (ref 0.00–0.07)
Basophils Absolute: 0.1 10*3/uL (ref 0.0–0.1)
Basophils Relative: 1 %
Eosinophils Absolute: 0.1 10*3/uL (ref 0.0–0.5)
Eosinophils Relative: 2 %
HCT: 40.8 % (ref 36.0–46.0)
Hemoglobin: 13.5 g/dL (ref 12.0–15.0)
Immature Granulocytes: 0 %
Lymphocytes Relative: 27 %
Lymphs Abs: 1.6 10*3/uL (ref 0.7–4.0)
MCH: 29 pg (ref 26.0–34.0)
MCHC: 33.1 g/dL (ref 30.0–36.0)
MCV: 87.7 fL (ref 80.0–100.0)
Monocytes Absolute: 0.8 10*3/uL (ref 0.1–1.0)
Monocytes Relative: 13 %
Neutro Abs: 3.3 10*3/uL (ref 1.7–7.7)
Neutrophils Relative %: 57 %
Platelet Count: 244 10*3/uL (ref 150–400)
RBC: 4.65 MIL/uL (ref 3.87–5.11)
RDW: 15 % (ref 11.5–15.5)
WBC Count: 6 10*3/uL (ref 4.0–10.5)
nRBC: 0 % (ref 0.0–0.2)

## 2022-09-15 LAB — CMP (CANCER CENTER ONLY)
ALT: 13 U/L (ref 0–44)
AST: 15 U/L (ref 15–41)
Albumin: 3.9 g/dL (ref 3.5–5.0)
Alkaline Phosphatase: 43 U/L (ref 38–126)
Anion gap: 6 (ref 5–15)
BUN: 19 mg/dL (ref 8–23)
CO2: 27 mmol/L (ref 22–32)
Calcium: 8.8 mg/dL — ABNORMAL LOW (ref 8.9–10.3)
Chloride: 108 mmol/L (ref 98–111)
Creatinine: 0.72 mg/dL (ref 0.44–1.00)
GFR, Estimated: >60 mL/min (ref >60)
Glucose, Bld: 95 mg/dL (ref 70–99)
Potassium: 3.7 mmol/L (ref 3.5–5.1)
Sodium: 141 mmol/L (ref 135–145)
Total Bilirubin: 0.4 mg/dL (ref 0.3–1.2)
Total Protein: 7.2 g/dL (ref 6.5–8.1)

## 2022-09-15 MED ORDER — SODIUM CHLORIDE 0.9 % IV SOLN: Freq: Once | INTRAVENOUS | Status: AC

## 2022-09-15 MED ORDER — ZOLEDRONIC ACID 4 MG/100ML IV SOLN
4.0000 mg | Freq: Once | INTRAVENOUS | Status: AC
  Administered 2022-09-15: 4 mg via INTRAVENOUS
  Filled 2022-09-15: qty 100

## 2022-09-15 NOTE — Progress Notes (Signed)
Per Cassie, PA ok to proceed with zometa with calcium 8.8.

## 2022-09-15 NOTE — Patient Instructions (Signed)
Zoledronic Acid Injection (Cancer) What is this medication? ZOLEDRONIC ACID (ZOE le dron ik AS id) treats high calcium levels in the blood caused by cancer. It may also be used with chemotherapy to treat weakened bones caused by cancer. It works by slowing down the release of calcium from bones. This lowers calcium levels in your blood. It also makes your bones stronger and less likely to break (fracture). It belongs to a group of medications called bisphosphonates. This medicine may be used for other purposes; ask your health care provider or pharmacist if you have questions. COMMON BRAND NAME(S): Zometa, Zometa Powder What should I tell my care team before I take this medication? They need to know if you have any of these conditions: Dehydration Dental disease Kidney disease Liver disease Low levels of calcium in the blood Lung or breathing disease, such as asthma Receiving steroids, such as dexamethasone or prednisone An unusual or allergic reaction to zoledronic acid, other medications, foods, dyes, or preservatives Pregnant or trying to get pregnant Breast-feeding How should I use this medication? This medication is injected into a vein. It is given by your care team in a hospital or clinic setting. Talk to your care team about the use of this medication in children. Special care may be needed. Overdosage: If you think you have taken too much of this medicine contact a poison control center or emergency room at once. NOTE: This medicine is only for you. Do not share this medicine with others. What if I miss a dose? Keep appointments for follow-up doses. It is important not to miss your dose. Call your care team if you are unable to keep an appointment. What may interact with this medication? Certain antibiotics given by injection Diuretics, such as bumetanide, furosemide NSAIDs, medications for pain and inflammation, such as ibuprofen or naproxen Teriparatide Thalidomide This list  may not describe all possible interactions. Give your health care provider a list of all the medicines, herbs, non-prescription drugs, or dietary supplements you use. Also tell them if you smoke, drink alcohol, or use illegal drugs. Some items may interact with your medicine. What should I watch for while using this medication? Visit your care team for regular checks on your progress. It may be some time before you see the benefit from this medication. Some people who take this medication have severe bone, joint, or muscle pain. This medication may also increase your risk for jaw problems or a broken thigh bone. Tell your care team right away if you have severe pain in your jaw, bones, joints, or muscles. Tell you care team if you have any pain that does not go away or that gets worse. Tell your dentist and dental surgeon that you are taking this medication. You should not have major dental surgery while on this medication. See your dentist to have a dental exam and fix any dental problems before starting this medication. Take good care of your teeth while on this medication. Make sure you see your dentist for regular follow-up appointments. You should make sure you get enough calcium and vitamin D while you are taking this medication. Discuss the foods you eat and the vitamins you take with your care team. Check with your care team if you have severe diarrhea, nausea, and vomiting, or if you sweat a lot. The loss of too much body fluid may make it dangerous for you to take this medication. You may need bloodwork while taking this medication. Talk to your care team if   you wish to become pregnant or think you might be pregnant. This medication can cause serious birth defects. What side effects may I notice from receiving this medication? Side effects that you should report to your care team as soon as possible: Allergic reactions--skin rash, itching, hives, swelling of the face, lips, tongue, or  throat Kidney injury--decrease in the amount of urine, swelling of the ankles, hands, or feet Low calcium level--muscle pain or cramps, confusion, tingling, or numbness in the hands or feet Osteonecrosis of the jaw--pain, swelling, or redness in the mouth, numbness of the jaw, poor healing after dental work, unusual discharge from the mouth, visible bones in the mouth Severe bone, joint, or muscle pain Side effects that usually do not require medical attention (report to your care team if they continue or are bothersome): Constipation Fatigue Fever Loss of appetite Nausea Stomach pain This list may not describe all possible side effects. Call your doctor for medical advice about side effects. You may report side effects to FDA at 1-800-FDA-1088. Where should I keep my medication? This medication is given in a hospital or clinic. It will not be stored at home. NOTE: This sheet is a summary. It may not cover all possible information. If you have questions about this medicine, talk to your doctor, pharmacist, or health care provider.  2023 Elsevier/Gold Standard (2022-01-07 00:00:00) Hypocalcemia, Adult Hypocalcemia is when the level of calcium in a person's blood is below normal. Calcium is a mineral that is used by the body in many ways. Not having enough blood calcium can affect the nervous system. This can lead to problems with the muscles, the heart, and the brain. What are the causes? This condition may be caused by: A deficiency of vitamin D or magnesium or both. Decreased levels of parathyroid hormone (hypoparathyroidism). Kidney function problems. Low levels of a body protein called albumin. Inflammation of the pancreas (pancreatitis). Not taking in enough vitamins and minerals in the diet or having intestinal problems that interfere with absorption of nutrients. Certain medicines. What are the signs or symptoms? Symptoms of this condition may include: Numbness and tingling in  the fingers, toes, or around the mouth. Muscle twitching, aches, or cramps, especially in the legs, feet, and back. Spasm of the voice box. This may make it difficult to breathe or speak. Fast heartbeats (palpitations) and abnormal heart rhythms (dysrhythmias). Shaking uncontrollably (seizures). Memory problems, confusion, or difficulty thinking. Depression, anxiety, irritability, or changes in personality. Long-term (chronic) symptoms of this condition may include: Coarse, brittle hair and nails. Dry skin or lasting skin diseases. These include psoriasis, eczema, or dermatitis. Dental cavities. Clouding of the eye lens (cataracts). Some people may not have any symptoms, especially if they have chronic hypocalcemia. How is this diagnosed?  This condition is usually diagnosed with a blood test. You may also have other tests to help determine the underlying cause of the condition. This may include more blood tests and imaging tests. How is this treated? This condition may be treated with: Calcium given by mouth or through an IV. The method used for giving calcium will depend on the severity of the condition. If your condition is severe, you may need to be closely monitored in the hospital. Giving other minerals (electrolytes), such as magnesium. Other treatment will depend on the cause of the condition. Follow these instructions at home: Follow diet instructions from your health care provider or dietitian. Take supplements only as told by your health care provider. Do not take an iron  supplement within 2 hours of taking a calcium supplement. Keep all follow-up visits. This is important. Contact a health care provider if: You have increased muscle twitching or cramps. You have new swelling in the feet, ankles, or legs. You develop changes in mood, memory, or personality. Get help right away if: You have chest pain. You have persistent rapid or irregular heartbeats. You have trouble  breathing. You faint. You start to have seizures. You have confusion. These symptoms may represent a serious problem that is an emergency. Do not wait to see if the symptoms will go away. Get medical help right away. Call your local emergency services (911 in the [place] Do not drive yourself to the hospital. Summary Hypocalcemia is when the level of calcium in a person's blood is below normal. Not having enough blood calcium can affect the nervous system. This condition may be treated with calcium given by mouth or through an IV, or by receiving other minerals. Other treatment depends on the cause of the condition. Take supplements only as told by your health care provider. Contact a health care provider if you have new or worsening symptoms. Keep all follow-up visits. This is important. This information is not intended to replace advice given to you by your health care provider. Make sure you discuss any questions you have with your health care provider. Document Revised: 04/29/2021 Document Reviewed: 04/29/2021 Elsevier Patient Education  [employer].

## 2022-09-16 ENCOUNTER — Ambulatory Visit: Payer: Medicare Other | Admitting: Physician Assistant

## 2022-09-16 ENCOUNTER — Ambulatory Visit: Payer: Medicare Other | Admitting: Nurse Practitioner

## 2022-09-16 ENCOUNTER — Ambulatory Visit: Payer: Medicare Other

## 2022-09-16 ENCOUNTER — Other Ambulatory Visit: Payer: Medicare Other

## 2022-11-22 ENCOUNTER — Telehealth: Payer: Self-pay | Admitting: Hematology

## 2022-11-22 NOTE — Telephone Encounter (Signed)
Called patient regarding April appointment, patient is notified.

## 2023-02-03 ENCOUNTER — Encounter: Payer: Self-pay | Admitting: Hematology

## 2023-02-04 ENCOUNTER — Encounter: Payer: Self-pay | Admitting: Hematology

## 2023-03-16 ENCOUNTER — Other Ambulatory Visit: Payer: Self-pay

## 2023-03-16 DIAGNOSIS — Z170 Estrogen receptor positive status [ER+]: Secondary | ICD-10-CM

## 2023-03-16 DIAGNOSIS — M810 Age-related osteoporosis without current pathological fracture: Secondary | ICD-10-CM

## 2023-03-17 ENCOUNTER — Inpatient Hospital Stay: Payer: Medicare Other | Attending: Hematology

## 2023-03-17 ENCOUNTER — Other Ambulatory Visit: Payer: Self-pay

## 2023-03-17 ENCOUNTER — Encounter: Payer: Self-pay | Admitting: Hematology

## 2023-03-17 ENCOUNTER — Inpatient Hospital Stay (HOSPITAL_BASED_OUTPATIENT_CLINIC_OR_DEPARTMENT_OTHER): Payer: Medicare Other | Admitting: Hematology

## 2023-03-17 VITALS — BP 117/73 | HR 63 | Temp 97.5°F | Resp 18 | Ht 63.0 in | Wt 189.8 lb

## 2023-03-17 DIAGNOSIS — Z170 Estrogen receptor positive status [ER+]: Secondary | ICD-10-CM

## 2023-03-17 DIAGNOSIS — C50412 Malignant neoplasm of upper-outer quadrant of left female breast: Secondary | ICD-10-CM | POA: Diagnosis not present

## 2023-03-17 DIAGNOSIS — M810 Age-related osteoporosis without current pathological fracture: Secondary | ICD-10-CM | POA: Insufficient documentation

## 2023-03-17 DIAGNOSIS — Z853 Personal history of malignant neoplasm of breast: Secondary | ICD-10-CM | POA: Diagnosis present

## 2023-03-17 LAB — CBC WITH DIFFERENTIAL (CANCER CENTER ONLY)
Abs Immature Granulocytes: 0.02 10*3/uL (ref 0.00–0.07)
Basophils Absolute: 0.1 10*3/uL (ref 0.0–0.1)
Basophils Relative: 1 %
Eosinophils Absolute: 0.2 10*3/uL (ref 0.0–0.5)
Eosinophils Relative: 4 %
HCT: 42.2 % (ref 36.0–46.0)
Hemoglobin: 13.5 g/dL (ref 12.0–15.0)
Immature Granulocytes: 0 %
Lymphocytes Relative: 26 %
Lymphs Abs: 1.7 10*3/uL (ref 0.7–4.0)
MCH: 28.4 pg (ref 26.0–34.0)
MCHC: 32 g/dL (ref 30.0–36.0)
MCV: 88.8 fL (ref 80.0–100.0)
Monocytes Absolute: 0.8 10*3/uL (ref 0.1–1.0)
Monocytes Relative: 12 %
Neutro Abs: 3.8 10*3/uL (ref 1.7–7.7)
Neutrophils Relative %: 57 %
Platelet Count: 264 10*3/uL (ref 150–400)
RBC: 4.75 MIL/uL (ref 3.87–5.11)
RDW: 14.8 % (ref 11.5–15.5)
WBC Count: 6.6 10*3/uL (ref 4.0–10.5)
nRBC: 0 % (ref 0.0–0.2)

## 2023-03-17 LAB — CMP (CANCER CENTER ONLY)
ALT: 13 U/L (ref 0–44)
AST: 14 U/L — ABNORMAL LOW (ref 15–41)
Albumin: 4.1 g/dL (ref 3.5–5.0)
Alkaline Phosphatase: 43 U/L (ref 38–126)
Anion gap: 5 (ref 5–15)
BUN: 23 mg/dL (ref 8–23)
CO2: 29 mmol/L (ref 22–32)
Calcium: 9.6 mg/dL (ref 8.9–10.3)
Chloride: 105 mmol/L (ref 98–111)
Creatinine: 0.81 mg/dL (ref 0.44–1.00)
GFR, Estimated: >60 mL/min (ref >60)
Glucose, Bld: 89 mg/dL (ref 70–99)
Potassium: 3.9 mmol/L (ref 3.5–5.1)
Sodium: 139 mmol/L (ref 135–145)
Total Bilirubin: 0.4 mg/dL (ref 0.3–1.2)
Total Protein: 7.6 g/dL (ref 6.5–8.1)

## 2023-03-17 LAB — VITAMIN D 25 HYDROXY (VIT D DEFICIENCY, FRACTURES): Vit D, 25-Hydroxy: 94.36 ng/mL (ref 30–100)

## 2023-03-17 NOTE — Assessment & Plan Note (Signed)
invasive ductal carcinoma, G1, T1N0 stage IA, ER+/ PR + HER2- -She was diagnosed 07/2012, treated with left lumpectomy and one sentinel node in 08/2012, local radiation and 5 years of Anastrozole 11/2012-11/2017. -most recent mammogram on 01/31/2023 at [facility] was benign. -From a breast cancer standpoint, she is clinically doing well. Lab reviewed, her CBC is within normal limits, CMP pending. Her physical exam was unremarkable.  -She will continue surveillance with yearly mammogram and breast exams.

## 2023-03-17 NOTE — Progress Notes (Signed)
[facility]   [phone] [phone]   Clinic Follow up Note   Patient Care Team: Lorelei PontMahoney, Mark, DO as PCP - General (Family Medicine) Lorelei PontMahoney, Mark, DO as Consulting Physician (Family Medicine) Lynden AngLane, Elizabeth, NP as Nurse Practitioner (Obstetrics and Gynecology) Lurline HareWentworth, Stacy, MD as Consulting Physician (Radiation Oncology) Lance BoschPalermo, James, MD as Consulting Physician (Radiation Oncology) Drue SecondKhan, Kalsoom, MD as Consulting Physician (Hematology and Oncology) Malachy MoodFeng, Amariss Detamore, MD as Consulting Physician (Hematology and Oncology)  Date of Service:  03/17/2023  CHIEF COMPLAINT: f/u of of left breast cancer    CURRENT THERAPY:  Surveillance and Zometa q6 months   ASSESSMENT:  Kristina BashKathleen Jupiter is a 77 y.o. 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 -[facility]. -From a breast cancer standpoint, she is clinically doing well. Lab reviewed, her CBC is within normal limits, CMP pending. Her physical exam was unremarkable.  -She will continue surveillance with yearly mammogram and breast exams. -she is 11 years out of her initial diagnosis, risk of recurrence is low now, we discussed what to watch at home, I will see her as needed.   Osteoporosis -Her 11/2018 DEXA showed mild improved with lowest T-score -1.6.  -Her most recent DEXA 01/2021 showed -1.8. -Continue calcium (2x daily) and Vitamin D.  She completed 2-year Zometa in 09/2022 -repeated bone density scan in 12/2022 showed osteopenia with T -1.7, overall slightly better than before     PLAN: -lab reviewed -continue calcium supplement and vitamin D -f/u  with PCP -Continue  annual mammogram -Discharge pt today. I will see her as needed    SUMMARY OF ONCOLOGIC HISTORY: Oncology History Overview Note  Cancer Staging Malignant neoplasm of upper-outer quadrant of left breast in female, estrogen receptor positive (HCC) Staging form: Breast, AJCC 7th Edition - Clinical: Stage IA (T1c, N0, cM0) - Unsigned Staging comments: Staged in breast conference 8.21.13  - Pathologic: No stage assigned - Unsigned    Malignant neoplasm of upper-outer quadrant of left breast in female, estrogen receptor positive  07/20/2012 Initial Biopsy   Biopsy found  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   07/21/2012 Initial Diagnosis   Malignant neoplasm of upper-outer quadrant of left breast in female, estrogen receptor positive (HCC)   08/11/2012 Pathology Results   Final pathology showed  1.7 cm grade 1 with DCIS low to intermediate grade, margins negative and one sentinel node negative    - 11/17/2012 Radiation Therapy   [place] in [place] by Dr. Thersa SaltPalermo    Oncotype testing   Dx score 18 = risk of recurrence 11%.   08/11/2012 Cancer Staging   Staging form: Breast, AJCC 7th Edition - Pathologic stage from 08/11/2012: Stage IA (T1c, N0, cM0) - Signed by Malachy MoodFeng, Hernando Reali, MD on 10/31/2020   11/2012 - 11/2017 Anti-estrogen oral therapy   Began Anastrozole 11/2012  Was held x2 weeks spring 2017, with full resolution of arthralgias, then resumed   Completed in 11/2017.       INTERVAL HISTORY:  Kristina Curry is here for a follow up of of left breast cancer  She was last seen by PA-C Cassie on 09/15/2022  She presents to the clinic accompanied by husband. Pt report that the last infusion went well. Pt report that she had a mammogram /bone density. Pt state her appetite is good.     All other systems were reviewed with the patient and are negative.  MEDICAL HISTORY:  Past Medical History:  Diagnosis Date    Arthritis    Back pain    Breast cancer    Dribbling of urine    Fatigue    GERD (gastroesophageal reflux disease)    Headache(784.0)    Osteoporosis, unspecified 07/16/2013   Ringing in ears    Sinus complaint    Skin cancer     SURGICAL HISTORY: Past Surgical History:  Procedure Laterality Date   BREAST LUMPECTOMY  08/21/12   COLONOSCOPY     EYE SURGERY  1986   lt cataract   ORIF PATELLA Left 05/27/2020   Procedure: OPEN REDUCTION INTERNAL (ORIF) FIXATION LEFT PATELLA;  Surgeon: Durene Romans, MD;  Location: [facility];  Service: Orthopedics;  Laterality: Left;   TUBAL LIGATION  1982    I have reviewed the social history and family history with the patient and they are unchanged from previous note.  ALLERGIES:  has No Known Allergies.  MEDICATIONS:  Current Outpatient Medications  Medication Sig Dispense Refill   ALPRAZolam (XANAX) 1 MG tablet Take 1 mg by mouth at bedtime as needed for anxiety or sleep.     Calcium Carb-Cholecalciferol (CALCIUM 600 + D PO) Take 1 tablet by mouth 2 (two) times daily.     diphenoxylate-atropine (LOMOTIL) 2.5-0.025 MG tablet Take 1 tablet by mouth 3 (three) times daily as needed.     fluticasone (FLONASE) 50 MCG/ACT nasal spray Place 1 spray into both nostrils daily.      Pseudoephedrine HCl (SUDAFED CONGESTION PO) Take 2 tablets by mouth 2 (two) times daily as needed (congestion).      Psyllium (ONELAX FIBER THERAPY PO) Take by mouth.     zolpidem (AMBIEN) 5 MG tablet Take 5 mg by mouth at bedtime as needed for sleep.      No current facility-administered medications for this visit.    PHYSICAL EXAMINATION: ECOG PERFORMANCE STATUS: 0 - Asymptomatic  Vitals:   03/17/23 1406  BP: 117/73  Pulse: 63  Resp: 18  Temp: (!) 97.5 F (36.4 C)  SpO2: 96%   Wt Readings from Last 3 Encounters:  03/17/23 189 lb 12.8 oz (86.1 kg)  09/15/22 189 lb 5 oz (85.9 kg)  03/18/22 191 lb 8 oz (86.9 kg)     GENERAL:alert, no distress and  comfortable SKIN: skin color normal, no rashes or significant lesions EYES: normal, Conjunctiva are pink and non-injected, sclera clear  NEURO: alert & oriented x 3 with fluent speech NECK:(-) supple, thyroid normal size, non-tender, without nodularity LYMPH:(-)   no palpable lymphadenopathy in the cervical, axillary  LUNGS: (-) clear to auscultation and percussion with normal breathing effort HEART: (-) regular rate & rhythm and no murmurs and no lower extremity edema ABDOMEN:(-) abdomen soft, (-) non-tender and (-) normal bowel sounds BREAST: rt breast no palpable mass, Lt breast little scar tissue,little smaller than the rt breast due to surgery.  LABORATORY DATA:  I have reviewed the data as listed    Latest Ref Rng & Units 03/17/2023    1:33 PM 09/15/2022   11:15 AM 03/18/2022    1:18 PM  CBC  WBC 4.0 - 10.5 K/uL 6.6  6.0  6.8   Hemoglobin 12.0 -  15.0 g/dL 17.6  16.0  73.7   Hematocrit 36.0 - 46.0 % 42.2  40.8  44.9   Platelets 150 - 400 K/uL 264  244  267         Latest Ref Rng & Units 03/17/2023    1:33 PM 09/15/2022   11:15 AM 03/18/2022    1:18 PM  CMP  Glucose 70 - 99 mg/dL 89  95  90   BUN 8 - 23 mg/dL 23  19  19    Creatinine 0.44 - 1.00 mg/dL 1.06  2.69  4.85   Sodium 135 - 145 mmol/L 139  141  138   Potassium 3.5 - 5.1 mmol/L 3.9  3.7  4.0   Chloride 98 - 111 mmol/L 105  108  108   CO2 22 - 32 mmol/L 29  27  24    Calcium 8.9 - 10.3 mg/dL 9.6  8.8  9.0   Total Protein 6.5 - 8.1 g/dL 7.6  7.2  7.7   Total Bilirubin 0.3 - 1.2 mg/dL 0.4  0.4  0.4   Alkaline Phos 38 - 126 U/L 43  43  41   AST 15 - 41 U/L 14  15  18    ALT 0 - 44 U/L 13  13  16        RADIOGRAPHIC STUDIES: I have personally reviewed the radiological images as listed and agreed with the findings in the report. No results found.    No orders of the defined types were placed in this encounter.  All questions were answered. The patient knows to call the clinic with any problems, questions or  concerns. No barriers to learning was detected. The total time spent in the appointment was 25 minutes.     Malachy Mood, MD 03/17/2023   Carolin Coy, CMA, am acting as scribe for Malachy Mood, MD.   I have reviewed the above documentation for accuracy and completeness, and I agree with the above.

## 2023-03-17 NOTE — Assessment & Plan Note (Signed)
-  Her 11/2018 DEXA showed mild improved with lowest T-score -1.6.  -Her most recent DEXA 01/2021 showed -1.8. -Continue calcium (2x daily) and Vitamin D.  She completed 2-year Zometa in 09/2022
